# Patient Record
Sex: Female | Born: 1950 | Race: White | Hispanic: No | Smoking: Former smoker
Health system: Southern US, Community
[De-identification: ages and names within clinical notes are randomized; demographics above are authoritative.]

## PROBLEM LIST (undated history)

## (undated) DIAGNOSIS — M19019 Primary osteoarthritis, unspecified shoulder: Secondary | ICD-10-CM

## (undated) DIAGNOSIS — E785 Hyperlipidemia, unspecified: Secondary | ICD-10-CM

## (undated) DIAGNOSIS — G459 Transient cerebral ischemic attack, unspecified: Secondary | ICD-10-CM

## (undated) DIAGNOSIS — K219 Gastro-esophageal reflux disease without esophagitis: Secondary | ICD-10-CM

## (undated) DIAGNOSIS — F32A Depression, unspecified: Secondary | ICD-10-CM

## (undated) DIAGNOSIS — I10 Essential (primary) hypertension: Secondary | ICD-10-CM

## (undated) DIAGNOSIS — F329 Major depressive disorder, single episode, unspecified: Secondary | ICD-10-CM

## (undated) HISTORY — DX: Gastro-esophageal reflux disease without esophagitis: K21.9

## (undated) HISTORY — DX: Transient cerebral ischemic attack, unspecified: G45.9

## (undated) HISTORY — DX: Depression, unspecified: F32.A

## (undated) HISTORY — DX: Major depressive disorder, single episode, unspecified: F32.9

## (undated) HISTORY — PX: OVARIAN CYST REMOVAL: SHX89

## (undated) HISTORY — DX: Primary osteoarthritis, unspecified shoulder: M19.019

## (undated) HISTORY — DX: Essential (primary) hypertension: I10

## (undated) HISTORY — DX: Hyperlipidemia, unspecified: E78.5

## (undated) HISTORY — PX: TONSILLECTOMY AND ADENOIDECTOMY: SUR1326

## (undated) HISTORY — PX: ABDOMINAL HYSTERECTOMY: SHX81

---

## 2004-02-29 HISTORY — PX: BREAST SURGERY: SHX581

## 2004-05-28 ENCOUNTER — Ambulatory Visit: Payer: Self-pay | Admitting: Unknown Physician Specialty

## 2004-06-10 ENCOUNTER — Encounter: Payer: Self-pay | Admitting: Unknown Physician Specialty

## 2004-06-28 ENCOUNTER — Encounter: Payer: Self-pay | Admitting: Unknown Physician Specialty

## 2004-07-29 ENCOUNTER — Encounter: Payer: Self-pay | Admitting: Unknown Physician Specialty

## 2004-08-26 ENCOUNTER — Ambulatory Visit: Payer: Self-pay | Admitting: Unknown Physician Specialty

## 2004-08-28 ENCOUNTER — Encounter: Payer: Self-pay | Admitting: Unknown Physician Specialty

## 2005-01-04 ENCOUNTER — Ambulatory Visit: Payer: Self-pay | Admitting: Unknown Physician Specialty

## 2007-11-14 ENCOUNTER — Ambulatory Visit: Payer: Self-pay | Admitting: Unknown Physician Specialty

## 2008-02-29 LAB — HM DEXA SCAN: HM DEXA SCAN: NORMAL

## 2008-03-12 ENCOUNTER — Ambulatory Visit: Payer: Self-pay | Admitting: Unknown Physician Specialty

## 2008-12-16 ENCOUNTER — Ambulatory Visit: Payer: Self-pay | Admitting: Unknown Physician Specialty

## 2008-12-31 ENCOUNTER — Ambulatory Visit: Payer: Self-pay | Admitting: Unknown Physician Specialty

## 2009-11-17 ENCOUNTER — Ambulatory Visit: Payer: Self-pay

## 2009-12-28 ENCOUNTER — Ambulatory Visit: Payer: Self-pay | Admitting: General Practice

## 2010-01-06 ENCOUNTER — Ambulatory Visit: Payer: Self-pay | Admitting: General Practice

## 2011-03-29 ENCOUNTER — Ambulatory Visit: Payer: Self-pay | Admitting: Unknown Physician Specialty

## 2011-05-24 ENCOUNTER — Ambulatory Visit (INDEPENDENT_AMBULATORY_CARE_PROVIDER_SITE_OTHER): Payer: BC Managed Care – PPO | Admitting: Internal Medicine

## 2011-05-24 ENCOUNTER — Encounter: Payer: Self-pay | Admitting: Internal Medicine

## 2011-05-24 VITALS — BP 110/78 | HR 65 | Temp 97.6°F | Ht 66.5 in | Wt 175.0 lb

## 2011-05-24 DIAGNOSIS — Z23 Encounter for immunization: Secondary | ICD-10-CM

## 2011-05-24 DIAGNOSIS — E785 Hyperlipidemia, unspecified: Secondary | ICD-10-CM

## 2011-05-24 DIAGNOSIS — K219 Gastro-esophageal reflux disease without esophagitis: Secondary | ICD-10-CM | POA: Insufficient documentation

## 2011-05-24 DIAGNOSIS — I10 Essential (primary) hypertension: Secondary | ICD-10-CM | POA: Insufficient documentation

## 2011-05-24 DIAGNOSIS — E119 Type 2 diabetes mellitus without complications: Secondary | ICD-10-CM

## 2011-05-24 DIAGNOSIS — F39 Unspecified mood [affective] disorder: Secondary | ICD-10-CM | POA: Insufficient documentation

## 2011-05-24 DIAGNOSIS — M19019 Primary osteoarthritis, unspecified shoulder: Secondary | ICD-10-CM | POA: Insufficient documentation

## 2011-05-24 DIAGNOSIS — F329 Major depressive disorder, single episode, unspecified: Secondary | ICD-10-CM

## 2011-05-24 DIAGNOSIS — G459 Transient cerebral ischemic attack, unspecified: Secondary | ICD-10-CM | POA: Insufficient documentation

## 2011-05-24 NOTE — Assessment & Plan Note (Signed)
Feels she has ADHD Mood lability under control with fluoxetine

## 2011-05-24 NOTE — Assessment & Plan Note (Signed)
Had good control with last A1c ~6 Will get her records Trying to get better on lifestyle again

## 2011-05-24 NOTE — Patient Instructions (Signed)
Please get last 2 years notes and labs, as well as all diagnostic tests (MRI, carotid, etc--no time restriction on this) from Dr Lin Givens

## 2011-05-24 NOTE — Assessment & Plan Note (Signed)
Good control Will reassess via her last labs and next time ?change to generic

## 2011-05-24 NOTE — Assessment & Plan Note (Signed)
BP Readings from Last 3 Encounters:  05/24/11 110/78   Good control No changes needed

## 2011-05-24 NOTE — Progress Notes (Signed)
Subjective:    Patient ID: Catherine Hardin, female    DOB: April 07, 1950, 61 y.o.   MRN: [id number]  HPI Establishing here Dr Lin Givens left  Lots of stress Husband with inoperable pancreatic cancer Now getting chemo regimen Very weak so she has sig caregiving responsibility  NIDDM diagnosed in January 2012 Oral meds Checks sugars intermittently---hard with husband's issues Has indulged in comfort foods and weight up 10# Had been running but has gotten out of the habit Last labs okay in December  High cholesterol diagnosed Control is very good on the low dose crestor  Chronic mood issues Mostly depression --- does cycle moods at times. Wonders about ADHD Fluoxetine keeps her level Using xanax just since husband's cancer diagnosis  GERD controlled with protonix No chronic cough No swallowing problems  Had past urinary incontinence Better with exercise program Has not needed the oxybutynin of late  No current outpatient prescriptions on file prior to visit.    Allergies  Allergen Reactions  . Penicillins     Past Medical History  Diagnosis Date  . Hyperlipidemia   . Hypertension   . GERD (gastroesophageal reflux disease)   . Diabetes mellitus   . Osteoarthritis, shoulder   . Depression     Past Surgical History  Procedure Date  . Breast surgery 2006    Breast reduction  . Abdominal hysterectomy   . Tonsillectomy and adenoidectomy   . Ovarian cyst removal ~1980s    Family History  Problem Relation Age of Onset  . Hypertension Mother   . Stroke Mother   . COPD Father   . Hypertension Father   . Heart disease Father   . ADD / ADHD Grandchild     History   Social History  . Marital Status: Married    Spouse Name: N/A    Number of Children: 3  . Years of Education: N/A   Occupational History  . 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 Smoking status: Former Smoker    Quit date: 03/01/1991  . Smokeless  tobacco: Never Used  . Alcohol Use: Yes  . Drug Use: No  . Sexually Active: Not on file   Other Topics Concern  . Not on file   Social History Narrative   2 children of husband's and one of her own   Review of Systems  Constitutional: Positive for unexpected weight change. Negative for fatigue.       Weight up 10# Wears seat belt  HENT: Positive for hearing loss and tinnitus. Negative for congestion and rhinorrhea.   Eyes: Negative for visual disturbance.       Due for eye exam  Respiratory: Negative for cough, chest tightness and shortness of breath.   Cardiovascular: Negative for chest pain, palpitations and leg swelling.  Gastrointestinal: Negative for nausea, vomiting, abdominal pain, constipation and blood in stool.       Heartburn controlled with med  Genitourinary: Negative for urgency and frequency.       Urine incontinence better now  Musculoskeletal: Positive for arthralgias. Negative for joint swelling and gait problem.  Neurological: Negative for dizziness, syncope, weakness, light-headedness, numbness and headaches.  Psychiatric/Behavioral: Positive for sleep disturbance and dysphoric mood. The patient is nervous/anxious and is hyperactive.        Some sleep problems with husband's illness Mood controlled with med       Objective:   Physical Exam  Constitutional: She appears well-developed and  well-nourished. No distress.  HENT:  Mouth/Throat: Oropharynx is clear and moist. No oropharyngeal exudate.  Neck: Normal range of motion. Neck supple. No thyromegaly present.  Cardiovascular: Normal rate, regular rhythm, normal heart sounds and intact distal pulses.  Exam reveals no gallop.   No murmur heard. Pulmonary/Chest: Effort normal and breath sounds normal. No respiratory distress. She has no wheezes. She has no rales.  Abdominal: Soft. There is no tenderness.  Musculoskeletal: She exhibits no edema and no tenderness.  Lymphadenopathy:    She has no cervical  adenopathy.  Skin: No rash noted. No erythema.       Scattered callouses on feet---at 1st and 5th MTPs and slightly on plantar side  Psychiatric: She has a normal mood and affect. Her behavior is normal. Judgment and thought content normal.          Assessment & Plan:

## 2011-05-25 ENCOUNTER — Ambulatory Visit: Payer: Self-pay | Admitting: Internal Medicine

## 2011-07-01 ENCOUNTER — Other Ambulatory Visit: Payer: Self-pay | Admitting: *Deleted

## 2011-07-01 NOTE — Telephone Encounter (Signed)
Faxed refill request   

## 2011-07-02 NOTE — Telephone Encounter (Signed)
Okay #60 x 0 

## 2011-07-04 MED ORDER — ALPRAZOLAM 0.25 MG PO TABS: 0.2500 mg | ORAL_TABLET | Freq: Two times a day (BID) | ORAL | Status: DC | PRN

## 2011-07-04 NOTE — Telephone Encounter (Signed)
rx called into pharmacy

## 2011-09-21 ENCOUNTER — Telehealth: Payer: Self-pay | Admitting: Internal Medicine

## 2011-09-21 NOTE — Telephone Encounter (Signed)
Pt was wanting to know if someone would call her back regarding some chest discomfort. She is not in any distress she wanted to speak with a nurse.

## 2011-09-22 ENCOUNTER — Telehealth: Payer: Self-pay | Admitting: Internal Medicine

## 2011-09-22 NOTE — Telephone Encounter (Signed)
.  left message to have patient return my call.  

## 2011-09-23 NOTE — Telephone Encounter (Signed)
.  left message to have patient return my call.  

## 2011-10-18 ENCOUNTER — Ambulatory Visit (INDEPENDENT_AMBULATORY_CARE_PROVIDER_SITE_OTHER): Payer: BC Managed Care – PPO | Admitting: Internal Medicine

## 2011-10-18 ENCOUNTER — Ambulatory Visit (INDEPENDENT_AMBULATORY_CARE_PROVIDER_SITE_OTHER)
Admission: RE | Admit: 2011-10-18 | Discharge: 2011-10-18 | Disposition: A | Discharge status: Home / Self Care | Payer: BC Managed Care – PPO | Source: Ambulatory Visit | Attending: Internal Medicine | Admitting: Internal Medicine

## 2011-10-18 ENCOUNTER — Encounter: Payer: Self-pay | Admitting: *Deleted

## 2011-10-18 ENCOUNTER — Encounter: Payer: Self-pay | Admitting: Internal Medicine

## 2011-10-18 VITALS — BP 118/70 | HR 67 | Temp 98.6°F | Ht 65.5 in | Wt 178.0 lb

## 2011-10-18 DIAGNOSIS — Z0000 Encounter for general adult medical examination without abnormal findings: Secondary | ICD-10-CM | POA: Insufficient documentation

## 2011-10-18 DIAGNOSIS — Z1211 Encounter for screening for malignant neoplasm of colon: Secondary | ICD-10-CM

## 2011-10-18 DIAGNOSIS — E785 Hyperlipidemia, unspecified: Secondary | ICD-10-CM

## 2011-10-18 DIAGNOSIS — M19019 Primary osteoarthritis, unspecified shoulder: Secondary | ICD-10-CM

## 2011-10-18 DIAGNOSIS — E119 Type 2 diabetes mellitus without complications: Secondary | ICD-10-CM

## 2011-10-18 DIAGNOSIS — R079 Chest pain, unspecified: Secondary | ICD-10-CM

## 2011-10-18 DIAGNOSIS — K219 Gastro-esophageal reflux disease without esophagitis: Secondary | ICD-10-CM

## 2011-10-18 DIAGNOSIS — I10 Essential (primary) hypertension: Secondary | ICD-10-CM

## 2011-10-18 DIAGNOSIS — F329 Major depressive disorder, single episode, unspecified: Secondary | ICD-10-CM

## 2011-10-18 DIAGNOSIS — F3289 Other specified depressive episodes: Secondary | ICD-10-CM

## 2011-10-18 LAB — BASIC METABOLIC PANEL
BUN: 14 mg/dL (ref 6–23)
CO2: 30 mEq/L (ref 19–32)
Chloride: 100 mEq/L (ref 96–112)
Creatinine, Ser: 0.7 mg/dL (ref 0.4–1.2)
Glucose, Bld: 85 mg/dL (ref 70–99)

## 2011-10-18 LAB — CBC WITH DIFFERENTIAL/PLATELET
Basophils Relative: 0.6 % (ref 0.0–3.0)
Eosinophils Absolute: 0.1 10*3/uL (ref 0.0–0.7)
MCHC: 33.2 g/dL (ref 30.0–36.0)
MCV: 90 fl (ref 78.0–100.0)
Monocytes Absolute: 0.3 10*3/uL (ref 0.1–1.0)
Neutrophils Relative %: 59.1 % (ref 43.0–77.0)
RBC: 4.22 Mil/uL (ref 3.87–5.11)
RDW: 13.9 % (ref 11.5–14.6)

## 2011-10-18 LAB — MICROALBUMIN / CREATININE URINE RATIO
Creatinine,U: 54.5 mg/dL
Microalb Creat Ratio: 0.9 mg/g (ref 0.0–30.0)
Microalb, Ur: 0.5 mg/dL (ref 0.0–1.9)

## 2011-10-18 LAB — LIPID PANEL: Cholesterol: 225 mg/dL — ABNORMAL HIGH (ref 0–200)

## 2011-10-18 LAB — HEPATIC FUNCTION PANEL
ALT: 47 U/L — ABNORMAL HIGH (ref 0–35)
Total Bilirubin: 0.7 mg/dL (ref 0.3–1.2)
Total Protein: 7.2 g/dL (ref 6.0–8.3)

## 2011-10-18 LAB — LDL CHOLESTEROL, DIRECT: Direct LDL: 145.9 mg/dL

## 2011-10-18 LAB — HEMOGLOBIN A1C: Hgb A1c MFr Bld: 6.6 % — ABNORMAL HIGH (ref 4.6–6.5)

## 2011-10-18 NOTE — Progress Notes (Signed)
Subjective:    Patient ID: Catherine Hardin, female    DOB: May 27, 1950, 61 y.o.   MRN: [id number]  HPI Here for physical Due for colonoscopy but stress with husband's cancer ---will do immunoassay  Weight was down some---just put it back on again though Checks sugars daily--occ bid.  Checks fasting and usually ~130  Mood is variable Has had trouble with expressive aphasia in past 2 months---had feeling of "something is not right" Cut back prozac to once a day and she felt better Then she stopped it and noticed improvement in energy levels  Speech back to normal Husband noticed a difference so she has restarted  Had some chest heaviness in mid sternum--and to back No evaluation as yet Backed off on ibuprofen Better now  Shoulder is still bad Feels like it dislocates at times Did need vicodin last night Needs ortho eval  Current Outpatient Prescriptions on File Prior to Visit  Medication Sig Dispense Refill  . ALPRAZolam (XANAX) 0.25 MG tablet Take 1 tablet (0.25 mg total) by mouth 2 (two) times daily as needed.  60 tablet  0  . aspirin 81 MG tablet Take 81 mg by mouth daily.       . CRESTOR 5 MG tablet Take 5 mg by mouth daily.       . hydrochlorothiazide (HYDRODIURIL) 25 MG tablet Take 12.5-25 mg by mouth daily.       . metFORMIN (GLUCOPHAGE) 1000 MG tablet Take 1,500 mg by mouth 2 (two) times daily with a meal.       . oxybutynin (DITROPAN XL) 15 MG 24 hr tablet Take 15 mg by mouth daily as needed.       . pantoprazole (PROTONIX) 40 MG tablet Take 40 mg by mouth daily.       Marland Kitchen PROZAC 20 MG capsule Take 20 mg by mouth every other day.         Allergies  Allergen Reactions  . Penicillins     Past Medical History  Diagnosis Date  . Hyperlipidemia   . Hypertension   . GERD (gastroesophageal reflux disease)   . Diabetes mellitus   . Osteoarthritis, shoulder   . Depression   . TIA (transient ischemic attack) ~2008    sensory symptoms in head. MRI/carotids negative     Past Surgical History  Procedure Date  . Breast surgery 2006    Breast reduction  . Abdominal hysterectomy   . Tonsillectomy and adenoidectomy   . Ovarian cyst removal ~1980s    Family History  Problem Relation Age of Onset  . Hypertension Mother   . Stroke Mother   . COPD Father   . Hypertension Father   . Heart disease Father   . ADD / ADHD Grandchild     History   Social History  . Marital Status: Married    Spouse Name: N/A    Number of Children: 3  . Years of Education: N/A   Occupational History  . 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 Smoking status: Former Smoker    Quit date: 03/01/1991  . Smokeless tobacco: Never Used  . Alcohol Use: Yes  . Drug Use: No  . Sexually Active: Not on file   Other Topics Concern  . Not on file   Social History Narrative   2 children of husband's and one of her own   Review of Systems  Constitutional: Negative for fatigue and unexpected  weight change.       Wears seat belt  HENT: Positive for hearing loss. Negative for congestion, rhinorrhea, dental problem and tinnitus.        Some inner ear equilibrium issues Regular with dentist  Eyes: Negative for visual disturbance.       No diplopia or unilateral vision loss  Respiratory: Negative for cough, chest tightness and shortness of breath.   Cardiovascular: Positive for chest pain and palpitations. Negative for leg swelling.       No associated symptoms other than slight palpitations with the chest pain No sig exercise but does ride bike and walks when at the beach  Gastrointestinal: Negative for nausea, vomiting, abdominal pain, constipation and blood in stool.       No heartburn  Genitourinary: Positive for frequency. Negative for difficulty urinating.       Some incontinence still Only uses the oxybutynin prn No sex --no problem  Musculoskeletal: Positive for arthralgias. Negative for back pain and joint swelling.   Skin: Negative for rash.       No suspicious lesions  Neurological: Positive for numbness. Negative for dizziness, syncope, weakness and light-headedness.       Mild right hand numbness at times---positional  Hematological: Negative for adenopathy. Does not bruise/bleed easily.  Psychiatric/Behavioral: Positive for disturbed wake/sleep cycle and dysphoric mood. The patient is nervous/anxious.        Sleeps okay other than when shoulder acts up Some depression and anxiety Seeing psychologist at [facility] with husband---dealing with cancer issues       Objective:   Physical Exam  Constitutional: She is oriented to person, place, and time. She appears well-developed and well-nourished. No distress.  HENT:  Head: Normocephalic and atraumatic.  Right Ear: External ear normal.  Left Ear: External ear normal.  Mouth/Throat: Oropharynx is clear and moist. No oropharyngeal exudate.  Eyes: Conjunctivae and EOM are normal. Pupils are equal, round, and reactive to light.  Neck: Normal range of motion. Neck supple. No thyromegaly present.  Cardiovascular: Normal rate, regular rhythm, normal heart sounds and intact distal pulses.  Exam reveals no gallop.   No murmur heard. Pulmonary/Chest: Effort normal and breath sounds normal. No respiratory distress. She has no wheezes. She has no rales.  Abdominal: Soft. There is no tenderness.  Musculoskeletal: She exhibits no edema.       Moderate pain with passive ROM of right shoulder and restriction---especially with internal rotation and abduction  Lymphadenopathy:    She has no cervical adenopathy.  Neurological: She is alert and oriented to person, place, and time.  Skin: No rash noted. No erythema.  Psychiatric: She has a normal mood and affect. Her behavior is normal. Thought content normal.          Assessment & Plan:

## 2011-10-18 NOTE — Assessment & Plan Note (Signed)
Side effects at previous fluoxetine dose Recurrence of symptoms off Will restart at every other day

## 2011-10-18 NOTE — Assessment & Plan Note (Signed)
BP Readings from Last 3 Encounters:  10/18/11 118/70  05/24/11 110/78   Good control No changes needed

## 2011-10-18 NOTE — Assessment & Plan Note (Addendum)
Seems like esophageal pain by history EKG is normal (just 1 PAC) CXR looks normal Discussed this--will just limit the ibuprofen and try to work fitness

## 2011-10-18 NOTE — Assessment & Plan Note (Signed)
Needs to work on lifestyle Will check labs

## 2011-10-18 NOTE — Assessment & Plan Note (Signed)
On the PPI but chest pain likely from reflux due to ibuprofen Better since off the ibuprofen

## 2011-10-18 NOTE — Assessment & Plan Note (Signed)
She will set up with ortho at [facility] knows them there

## 2011-10-18 NOTE — Assessment & Plan Note (Signed)
No problems with statin Due for labs 

## 2011-10-18 NOTE — Assessment & Plan Note (Signed)
Needs to work on fitness Due for colon but no time---will do stool immunoassay Had mammo

## 2011-10-20 ENCOUNTER — Encounter: Payer: Self-pay | Admitting: *Deleted

## 2011-11-02 ENCOUNTER — Other Ambulatory Visit: Payer: Self-pay | Admitting: Internal Medicine

## 2011-11-02 NOTE — Telephone Encounter (Signed)
Called in as directed. JJ at [facility] asks that you call her in reguards to patient's metformin. The patient apparently has asked the pharmacist to speak with you. [phone]

## 2011-11-02 NOTE — Telephone Encounter (Signed)
Okay #60 x 0 

## 2011-11-03 ENCOUNTER — Other Ambulatory Visit: Payer: Self-pay | Admitting: *Deleted

## 2011-11-03 MED ORDER — OXYBUTYNIN CHLORIDE ER 15 MG PO TB24: 15.0000 mg | ORAL_TABLET | Freq: Every day | ORAL | Status: DC | PRN

## 2011-11-03 MED ORDER — GLUCOSE BLOOD VI STRP: ORAL_STRIP | Status: AC

## 2011-11-03 NOTE — Telephone Encounter (Signed)
Left message for Catherine Hardin  i did note the dose was too high Will decrease to 1000mg  bid If there was something else, she should call back

## 2011-11-07 ENCOUNTER — Other Ambulatory Visit: Payer: Self-pay | Admitting: Internal Medicine

## 2011-11-24 ENCOUNTER — Other Ambulatory Visit: Payer: Self-pay | Admitting: Internal Medicine

## 2011-11-25 NOTE — Telephone Encounter (Signed)
Okay #60 x 0 

## 2011-11-25 NOTE — Telephone Encounter (Signed)
rx called into pharmacy

## 2011-12-23 HISTORY — PX: ROTATOR CUFF REPAIR: SHX139

## 2012-01-02 ENCOUNTER — Ambulatory Visit (INDEPENDENT_AMBULATORY_CARE_PROVIDER_SITE_OTHER): Payer: BC Managed Care – PPO | Admitting: Internal Medicine

## 2012-01-02 ENCOUNTER — Telehealth: Payer: Self-pay | Admitting: Internal Medicine

## 2012-01-02 ENCOUNTER — Encounter: Payer: Self-pay | Admitting: Internal Medicine

## 2012-01-02 VITALS — BP 112/70 | HR 78 | Temp 97.7°F | Wt 174.0 lb

## 2012-01-02 DIAGNOSIS — Z23 Encounter for immunization: Secondary | ICD-10-CM

## 2012-01-02 DIAGNOSIS — R1013 Epigastric pain: Secondary | ICD-10-CM

## 2012-01-02 NOTE — Addendum Note (Signed)
Addended by: Sueanne Margarita on: 01/02/2012 04:14 PM   Modules accepted: Orders

## 2012-01-02 NOTE — Assessment & Plan Note (Signed)
This could be gallbladder but location is not classic Pain into back could be gallbladder or ulcer Possible ulcer back 30-40 years ago Not aware of H pylori status Pancreatitis unlikely  Still drinks coffee Has stopped ibuprofen about 4 days ago  P: will check H pylori and lipase and CBC     RUQ ultrasound--if negative, will need reevaluation by Dr Mechele Collin. If positive, Dr Renda Rolls     Increase protonix to bid     Could consider empiric antibiotics if H pylori positive

## 2012-01-02 NOTE — Progress Notes (Signed)
Subjective:    Patient ID: Catherine Hardin, female    DOB: 02/02/1951, 61 y.o.   MRN: [id number]  HPI Here with husband and DIL  Was evaluated for the chest or epigastric pain Has persisted Gave up ibuprofen Last 2 days it has been worse----and worse when belches Tender under right ribs at times  Fairly constant pain but variable in intensity Mild a lot but unbearable at times occ worse after meals but not consistently  Pain radiates to back  No change in bowels No fever  No cough or SOB  Current Outpatient Prescriptions on File Prior to Visit  Medication Sig Dispense Refill  . aspirin 81 MG tablet Take 81 mg by mouth daily.       . CRESTOR 5 MG tablet Take 5 mg by mouth daily.       Marland Kitchen glucose blood test strip Use twice daily or as instructed  100 each  12  . hydrochlorothiazide (HYDRODIURIL) 25 MG tablet Take 12.5-25 mg by mouth daily.       . metFORMIN (GLUCOPHAGE) 1000 MG tablet Take 1/2 tab in the morning and 1 tab in the evening      . pantoprazole (PROTONIX) 40 MG tablet Take 40 mg by mouth daily.       Marland Kitchen PROZAC 20 MG capsule Take 20 mg by mouth every other day.       Marland Kitchen XANAX 0.25 MG tablet TAKE ONE TABLET BY MOUTH TWICE A DAY AS NEEDED.  60 tablet  0    Allergies  Allergen Reactions  . Penicillins     Past Medical History  Diagnosis Date  . Hyperlipidemia   . Hypertension   . GERD (gastroesophageal reflux disease)   . Diabetes mellitus   . Osteoarthritis, shoulder   . Depression   . TIA (transient ischemic attack) ~2008    sensory symptoms in head. MRI/carotids negative    Past Surgical History  Procedure Date  . Breast surgery 2006    Breast reduction  . Abdominal hysterectomy   . Tonsillectomy and adenoidectomy   . Ovarian cyst removal ~1980s    Family History  Problem Relation Age of Onset  . Hypertension Mother   . Stroke Mother   . COPD Father   . Hypertension Father   . Heart disease Father   . ADD / ADHD Grandchild     History    Social History  . Marital Status: Married    Spouse Name: N/A    Number of Children: 3  . Years of Education: N/A   Occupational History  . 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 Smoking status: Former Smoker    Quit date: 03/01/1991  . Smokeless tobacco: Never Used  . Alcohol Use: Yes  . Drug Use: No  . Sexually Active: Not on file   Other Topics Concern  . Not on file   Social History Narrative   2 children of husband's and one of her own   Review of Systems Some nausea in last couple of days No vomiting Appetite is okay but has tried to eat bland food Bowels are normal---no blood or change in color    Objective:   Physical Exam  Constitutional: She appears well-developed and well-nourished. No distress.  Neck: Normal range of motion. Neck supple.  Cardiovascular: Normal rate, regular rhythm and normal heart sounds.   Pulmonary/Chest: Effort normal and breath sounds normal. No respiratory  distress. She has no wheezes. She has no rales.  Abdominal: Soft. Bowel sounds are normal. She exhibits no distension and no mass. There is tenderness. There is no rebound and no guarding.       Mild epigastric tenderness without rebound or guarding  Musculoskeletal: She exhibits no edema.  Lymphadenopathy:    She has no cervical adenopathy.          Assessment & Plan:

## 2012-01-02 NOTE — Telephone Encounter (Signed)
See OV note.  

## 2012-01-02 NOTE — Telephone Encounter (Signed)
Caller: Aurielle/Patient; Patient Name: Catherine Hardin; PCP: Tillman Abide; Best Callback Phone Number: [phone]; Reason for call: Chest Pain/Chest Discomfort; Onset  x 1 month; CXR and EKG done in office 1 month ago which were normal. Reports increase in frequency of pain. Reports tenderness to the touch. Reports the pain is below the bottom of the sternum and the bottom of the chest/top of abdomen. Triaged per Chest Pain guideline, disposition: see provider within 72 hours for "Pain brought on by, or made worse by, pressure on a localized area and not previously evaluated." Appointment scheduled 01/02/12 at 2:45pm with Dr. Alphonsus Sias. Care advice and call back parameters given per guideline. Patient verbalized understanding.

## 2012-01-03 LAB — HEPATIC FUNCTION PANEL
ALT: 31 U/L (ref 0–35)
Albumin: 4.1 g/dL (ref 3.5–5.2)
Alkaline Phosphatase: 56 U/L (ref 39–117)
Bilirubin, Direct: 0.1 mg/dL (ref 0.0–0.3)

## 2012-01-03 LAB — CBC WITH DIFFERENTIAL/PLATELET
Basophils Relative: 2 % (ref 0.0–3.0)
Eosinophils Absolute: 0.1 10*3/uL (ref 0.0–0.7)
Eosinophils Relative: 1.9 % (ref 0.0–5.0)
Lymphocytes Relative: 34.2 % (ref 12.0–46.0)
Monocytes Relative: 5.7 % (ref 3.0–12.0)
Neutrophils Relative %: 56.2 % (ref 43.0–77.0)
RBC: 4.19 Mil/uL (ref 3.87–5.11)
WBC: 6.8 10*3/uL (ref 4.5–10.5)

## 2012-01-03 LAB — HELICOBACTER PYLORI  ANTIBODY, IGM: Helicobacter pylori, IgM: 1.7 U/mL

## 2012-01-03 LAB — LIPASE: Lipase: 41 U/L (ref 11.0–59.0)

## 2012-01-04 ENCOUNTER — Telehealth: Payer: Self-pay | Admitting: Internal Medicine

## 2012-01-04 ENCOUNTER — Ambulatory Visit: Payer: Self-pay | Admitting: Internal Medicine

## 2012-01-04 ENCOUNTER — Encounter: Payer: Self-pay | Admitting: Internal Medicine

## 2012-01-04 MED ORDER — PREDNISONE 20 MG PO TABS: 40.0000 mg | ORAL_TABLET | Freq: Every day | ORAL | Status: DC

## 2012-01-04 NOTE — Telephone Encounter (Signed)
Caller: Jose/Patient; Patient Name: Catherine Hardin; PCP: Tillman Abide; Best Callback Phone Number: [phone]; Calling regarding poison oak on 11/4.  Onset of it worsening was noted 01/03/12    hands, wrists,  forearms, feet, nape of neck in hairline and buttocks.   Has taken benadryl, has helped slightly--creams not helping;  Was unable to sleep last night.  Areas on fingers are starting blistering.  Triaged using [place], [place] or [place] with a disposition to be seen within 4 hours.  Patient declined appointment requesting a steroid dose pack instead.  Care advice given which patient has done most already.  Pharmacy is [facility] at [phone].  Last appointment with Dr. Alphonsus Sias was 01/02/12 and is coming out to see her husband who is a patient today at 14:30.  OFFICE:  PLEASE FOLLOW UP WITH PATIENT REGARDING REQUESTED TREATMENT FOR POISON OAK-WOULD LIKE A STEROID DOSE PACK CALLED IN TO PHARMACY OF CHOICE.  PLEASE NOTIFY HER WHEN CALLED IN. THANKS

## 2012-01-04 NOTE — Telephone Encounter (Signed)
pts wife notified it will be around 3:30 when Dr Alphonsus Sias will get there.

## 2012-01-04 NOTE — Telephone Encounter (Signed)
Has contact dermatitis that seemed to be improving but now worsened I checked her when at home visit for husband  Widespread rash with hand swelling Will send Rx for prednisone

## 2012-01-04 NOTE — Telephone Encounter (Signed)
Please let her know I will look at it when I see her husband and decide if steroid Rx is appropriate

## 2012-01-04 NOTE — Telephone Encounter (Signed)
Patient notified as instructed by telephone. 

## 2012-01-31 ENCOUNTER — Other Ambulatory Visit: Payer: Self-pay | Admitting: *Deleted

## 2012-01-31 MED ORDER — ALPRAZOLAM 0.25 MG PO TABS: 0.2500 mg | ORAL_TABLET | Freq: Two times a day (BID) | ORAL | Status: DC | PRN

## 2012-01-31 MED ORDER — PANTOPRAZOLE SODIUM 40 MG PO TBEC: 40.0000 mg | DELAYED_RELEASE_TABLET | Freq: Two times a day (BID) | ORAL | Status: DC

## 2012-01-31 NOTE — Telephone Encounter (Signed)
rx called into pharmacy

## 2012-01-31 NOTE — Telephone Encounter (Signed)
Okay #60 x 0 

## 2012-03-02 ENCOUNTER — Other Ambulatory Visit: Payer: Self-pay

## 2012-03-02 MED ORDER — ALPRAZOLAM 0.25 MG PO TABS: 0.2500 mg | ORAL_TABLET | Freq: Two times a day (BID) | ORAL | Status: DC | PRN

## 2012-03-02 NOTE — Telephone Encounter (Signed)
[facility] refill alprazolam. Last filled 01/31/12.Marland KitchenPlease advise.

## 2012-03-02 NOTE — Telephone Encounter (Signed)
plz phone in. 

## 2012-03-05 NOTE — Telephone Encounter (Signed)
Rx called in as directed.   

## 2012-05-16 ENCOUNTER — Other Ambulatory Visit: Payer: Self-pay | Admitting: *Deleted

## 2012-05-17 MED ORDER — ALPRAZOLAM 0.25 MG PO TABS: 0.2500 mg | ORAL_TABLET | Freq: Two times a day (BID) | ORAL | Status: DC | PRN

## 2012-05-17 NOTE — Telephone Encounter (Signed)
Okay #60 x 0 

## 2012-05-17 NOTE — Telephone Encounter (Signed)
rx called into pharmacy

## 2012-08-22 ENCOUNTER — Other Ambulatory Visit: Payer: Self-pay | Admitting: *Deleted

## 2012-08-23 MED ORDER — ALPRAZOLAM 0.25 MG PO TABS: 0.2500 mg | ORAL_TABLET | Freq: Two times a day (BID) | ORAL | Status: DC | PRN

## 2012-08-23 NOTE — Telephone Encounter (Signed)
rx called into pharmacy

## 2012-08-23 NOTE — Telephone Encounter (Signed)
Okay #60 x 0 

## 2012-11-07 ENCOUNTER — Other Ambulatory Visit: Payer: Self-pay | Admitting: Internal Medicine

## 2013-01-03 ENCOUNTER — Other Ambulatory Visit: Payer: Self-pay

## 2013-01-31 ENCOUNTER — Other Ambulatory Visit: Payer: Self-pay | Admitting: Internal Medicine

## 2013-01-31 NOTE — Telephone Encounter (Signed)
Refill requests, but no recent or future appts. pls advise

## 2013-02-01 NOTE — Telephone Encounter (Signed)
Her husband just died.....  Okay to refill for 3 months each Have her set up an appt within that time

## 2013-02-05 ENCOUNTER — Ambulatory Visit (INDEPENDENT_AMBULATORY_CARE_PROVIDER_SITE_OTHER): Payer: BC Managed Care – PPO | Admitting: Internal Medicine

## 2013-02-05 ENCOUNTER — Encounter: Payer: Self-pay | Admitting: Internal Medicine

## 2013-02-05 VITALS — BP 128/80 | HR 73 | Temp 98.3°F | Wt 180.0 lb

## 2013-02-05 DIAGNOSIS — E119 Type 2 diabetes mellitus without complications: Secondary | ICD-10-CM

## 2013-02-05 DIAGNOSIS — J019 Acute sinusitis, unspecified: Secondary | ICD-10-CM

## 2013-02-05 DIAGNOSIS — I10 Essential (primary) hypertension: Secondary | ICD-10-CM

## 2013-02-05 LAB — CBC WITH DIFFERENTIAL/PLATELET
Basophils Relative: 0.5 % (ref 0.0–3.0)
Eosinophils Absolute: 0.2 10*3/uL (ref 0.0–0.7)
Eosinophils Relative: 2.3 % (ref 0.0–5.0)
HCT: 38.9 % (ref 36.0–46.0)
Lymphocytes Relative: 34.8 % (ref 12.0–46.0)
Lymphs Abs: 2.3 10*3/uL (ref 0.7–4.0)
MCHC: 33.7 g/dL (ref 30.0–36.0)
MCV: 90 fl (ref 78.0–100.0)
Monocytes Absolute: 0.4 10*3/uL (ref 0.1–1.0)
Neutrophils Relative %: 57 % (ref 43.0–77.0)
Platelets: 187 10*3/uL (ref 150.0–400.0)
RBC: 4.33 Mil/uL (ref 3.87–5.11)
RDW: 14.1 % (ref 11.5–14.6)
WBC: 6.6 10*3/uL (ref 4.5–10.5)

## 2013-02-05 LAB — BASIC METABOLIC PANEL
BUN: 15 mg/dL (ref 6–23)
CO2: 26 mEq/L (ref 19–32)
GFR: 80 mL/min (ref >60.00)
Glucose, Bld: 105 mg/dL — ABNORMAL HIGH (ref 70–99)
Potassium: 4.1 mEq/L (ref 3.5–5.1)
Sodium: 134 mEq/L — ABNORMAL LOW (ref 135–145)

## 2013-02-05 LAB — LIPID PANEL
HDL: 57.8 mg/dL (ref >39.00)
Total CHOL/HDL Ratio: 5
Triglycerides: 281 mg/dL — ABNORMAL HIGH (ref 0.0–149.0)

## 2013-02-05 LAB — HEPATIC FUNCTION PANEL
Albumin: 4.5 g/dL (ref 3.5–5.2)
Alkaline Phosphatase: 59 U/L (ref 39–117)
Bilirubin, Direct: 0 mg/dL (ref 0.0–0.3)

## 2013-02-05 LAB — MICROALBUMIN / CREATININE URINE RATIO
Creatinine,U: 189.8 mg/dL
Microalb, Ur: 2.9 mg/dL — ABNORMAL HIGH (ref 0.0–1.9)

## 2013-02-05 LAB — TSH: TSH: 5.03 u[IU]/mL (ref 0.35–5.50)

## 2013-02-05 MED ORDER — AZITHROMYCIN 250 MG PO TABS: ORAL_TABLET | ORAL | Status: DC

## 2013-02-05 NOTE — Progress Notes (Signed)
Subjective:    Patient ID: Catherine Hardin, female    DOB: 11/22/1950, 62 y.o.   MRN: [id number]  HPI Having a real hard time with grieving Got sick right after husband died-- ~7 weeks ago Lost voice,etc Has kept lingering cough and flet like she was starting to improve Yesterday she felt worse Started with raw feeling in chest  Post nasal drip then increased  Very tired No fever No SOB No ear pain Occasional headaches  Tried cetirizine--not really helping Also ibuprofen  Sugars up a bit Hasn't been as careful with diet with all the stress Weight up slightly  Current Outpatient Prescriptions on File Prior to Visit  Medication Sig Dispense Refill  . ALPRAZolam (XANAX) 0.25 MG tablet Take 1 tablet (0.25 mg total) by mouth 2 (two) times daily as needed.  60 tablet  0  . aspirin 81 MG tablet Take 81 mg by mouth daily.       Marland Kitchen glucose blood test strip Use twice daily or as instructed  100 each  12  . hydrochlorothiazide (HYDRODIURIL) 25 MG tablet Take 12.5-25 mg by mouth daily.       . metFORMIN (GLUCOPHAGE) 1000 MG tablet TAKE ONE TABLET BY MOUTH TWICE A DAY. (DIABETES)  60 tablet  2  . oxybutynin (DITROPAN XL) 15 MG 24 hr tablet TAKE ONE TABLET BY MOUTH ONCE A DAY FOR BLADDER.  30 tablet  11  . pantoprazole (PROTONIX) 40 MG tablet TAKE ONE TABLET BY MOUTH TWICE A DAY. (G.E.R.D.) DO NOT CRUSH  60 tablet  2  . PROZAC 20 MG capsule Take 20 mg by mouth every other day.        No current facility-administered medications on file prior to visit.    Allergies  Allergen Reactions  . Penicillins     Past Medical History  Diagnosis Date  . Hyperlipidemia   . Hypertension   . GERD (gastroesophageal reflux disease)   . Diabetes mellitus   . Osteoarthritis, shoulder   . Depression   . TIA (transient ischemic attack) ~2008    sensory symptoms in head. MRI/carotids negative    Past Surgical History  Procedure Laterality Date  . Breast surgery  2006    Breast reduction  .  Abdominal hysterectomy    . Tonsillectomy and adenoidectomy    . Ovarian cyst removal  ~1980s  . Rotator cuff repair  12/23/11    Right ---Dr Daphine Deutscher at [facility] History  Problem Relation Age of Onset  . Hypertension Mother   . Stroke Mother   . COPD Father   . Hypertension Father   . Heart disease Father   . ADD / ADHD Grandchild     History   Social History  . Marital Status: Widowed    Spouse Name: N/A    Number of Children: 3  . Years of Education: N/A   Occupational History  . 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 Smoking status: Former Smoker    Quit date: 03/01/1991  . Smokeless tobacco: Never Used  . Alcohol Use: Yes  . Drug Use: No  . Sexual Activity: Not on file   Other Topics Concern  . Not on file   Social History Narrative   2 children of husband's and one of her own         Review of Systems No rash No vomiting or diarrhea Still eating okay  Objective:   Physical Exam  Constitutional: She appears well-developed and well-nourished. No distress.  HENT:  Mouth/Throat: Oropharynx is clear and moist. No oropharyngeal exudate.  No sinus tenderness TMs normal Marked nasal inflammation  Neck: Normal range of motion. Neck supple.  Pulmonary/Chest: Effort normal and breath sounds normal. No respiratory distress. She has no wheezes. She has no rales.  Lymphadenopathy:    She has no cervical adenopathy.          Assessment & Plan:

## 2013-02-05 NOTE — Assessment & Plan Note (Signed)
Has been checking sugars daily Weight is up some---not careful with her eating 140 fasting lately

## 2013-02-05 NOTE — Assessment & Plan Note (Signed)
BP Readings from Last 3 Encounters:  02/05/13 128/80  01/02/12 112/70  10/18/11 118/70   Good control still

## 2013-02-05 NOTE — Assessment & Plan Note (Signed)
Seems to have secondary bacterial infection Will treat with z-pak

## 2013-03-11 ENCOUNTER — Other Ambulatory Visit: Payer: Self-pay | Admitting: Internal Medicine

## 2013-04-29 ENCOUNTER — Encounter: Payer: Self-pay | Admitting: Internal Medicine

## 2013-06-04 ENCOUNTER — Other Ambulatory Visit: Payer: Self-pay | Admitting: Internal Medicine

## 2013-07-16 ENCOUNTER — Other Ambulatory Visit: Payer: Self-pay | Admitting: *Deleted

## 2013-07-16 MED ORDER — HYDROCHLOROTHIAZIDE 25 MG PO TABS: 12.5000 mg | ORAL_TABLET | Freq: Every day | ORAL | Status: DC | PRN

## 2013-08-07 ENCOUNTER — Telehealth: Payer: Self-pay | Admitting: Internal Medicine

## 2013-08-07 ENCOUNTER — Ambulatory Visit (INDEPENDENT_AMBULATORY_CARE_PROVIDER_SITE_OTHER): Payer: Medicare Other | Admitting: Internal Medicine

## 2013-08-07 ENCOUNTER — Encounter: Payer: Self-pay | Admitting: Internal Medicine

## 2013-08-07 VITALS — BP 120/78 | HR 65 | Temp 98.3°F | Ht 65.5 in | Wt 169.0 lb

## 2013-08-07 DIAGNOSIS — G4733 Obstructive sleep apnea (adult) (pediatric): Secondary | ICD-10-CM | POA: Insufficient documentation

## 2013-08-07 DIAGNOSIS — I10 Essential (primary) hypertension: Secondary | ICD-10-CM

## 2013-08-07 DIAGNOSIS — F39 Unspecified mood [affective] disorder: Secondary | ICD-10-CM

## 2013-08-07 DIAGNOSIS — G4730 Sleep apnea, unspecified: Secondary | ICD-10-CM

## 2013-08-07 DIAGNOSIS — E785 Hyperlipidemia, unspecified: Secondary | ICD-10-CM

## 2013-08-07 DIAGNOSIS — Z0000 Encounter for general adult medical examination without abnormal findings: Secondary | ICD-10-CM

## 2013-08-07 DIAGNOSIS — E119 Type 2 diabetes mellitus without complications: Secondary | ICD-10-CM

## 2013-08-07 DIAGNOSIS — Z23 Encounter for immunization: Secondary | ICD-10-CM

## 2013-08-07 LAB — MICROALBUMIN / CREATININE URINE RATIO
CREATININE, U: 61.9 mg/dL
MICROALB/CREAT RATIO: 4.7 mg/g (ref 0.0–30.0)
Microalb, Ur: 2.9 mg/dL — ABNORMAL HIGH (ref 0.0–1.9)

## 2013-08-07 LAB — HEMOGLOBIN A1C: HEMOGLOBIN A1C: 6.3 % (ref 4.6–6.5)

## 2013-08-07 LAB — HM DIABETES FOOT EXAM

## 2013-08-07 MED ORDER — ATORVASTATIN CALCIUM 40 MG PO TABS: 40.0000 mg | ORAL_TABLET | Freq: Every day | ORAL | Status: DC

## 2013-08-07 MED ORDER — ZOSTER VACCINE LIVE 19400 UNT/0.65ML ~~LOC~~ SOLR: 0.6500 mL | Freq: Once | SUBCUTANEOUS | Status: DC

## 2013-08-07 NOTE — Addendum Note (Signed)
Addended by: Sueanne Margarita on: 08/07/2013 11:45 AM   Modules accepted: Orders

## 2013-08-07 NOTE — Patient Instructions (Addendum)
Please contact Dr Vira Agar about setting up your screening colonoscopy. Please set up screening mammogram. Please set up your appointment with Dr Gloriann Loan for your eye exam Please restart vitamin D--- [phone] int units daily. Please set up blood work in about 6 weeks--- met c and lipid (272.4)  Diabetes Meal Planning Guide The diabetes meal planning guide is a tool to help you plan your meals and snacks. It is important for people with diabetes to manage their blood glucose (sugar) levels. Choosing the right foods and the right amounts throughout your day will help control your blood glucose. Eating right can even help you improve your blood pressure and reach or maintain a healthy weight. CARBOHYDRATE COUNTING MADE EASY When you eat carbohydrates, they turn to sugar. This raises your blood glucose level. Counting carbohydrates can help you control this level so you feel better. When you plan your meals by counting carbohydrates, you can have more flexibility in what you eat and balance your medicine with your food intake. Carbohydrate counting simply means adding up the total amount of carbohydrate grams in your meals and snacks. Try to eat about the same amount at each meal. Foods with carbohydrates are listed below. Each portion below is 1 carbohydrate serving or 15 grams of carbohydrates. Ask your dietician how many grams of carbohydrates you should eat at each meal or snack. Grains and Starches  1 slice bread.   English muffin or hotdog/hamburger bun.   cup cold cereal (unsweetened).   cup cooked pasta or rice.   cup starchy vegetables (corn, potatoes, peas, beans, winter squash).  1 tortilla (6 inches).   bagel.  1 waffle or pancake (size of a CD).   cup cooked cereal.  4 to 6 small crackers. *Whole grain is recommended. Fruit  1 cup fresh unsweetened berries, melon, papaya, pineapple.  1 small fresh fruit.   banana or mango.   cup fruit juice (4 oz  unsweetened).   cup canned fruit in natural juice or water.  2 tbs dried fruit.  12 to 15 grapes or cherries. Milk and Yogurt  1 cup fat-free or 1% milk.  1 cup soy milk.  6 oz light yogurt with sugar-free sweetener.  6 oz low-fat soy yogurt.  6 oz plain yogurt. Vegetables  1 cup raw or  cup cooked is counted as 0 carbohydrates or a "free" food.  If you eat 3 or more servings at 1 meal, count them as 1 carbohydrate serving. Other Carbohydrates   oz chips or pretzels.   cup ice cream or frozen yogurt.   cup sherbet or sorbet.  2 inch square cake, no frosting.  1 tbs honey, sugar, jam, jelly, or syrup.  2 small cookies.  3 squares of graham crackers.  3 cups popcorn.  6 crackers.  1 cup broth-based soup.  Count 1 cup casserole or other mixed foods as 2 carbohydrate servings.  Foods with less than 20 calories in a serving may be counted as 0 carbohydrates or a "free" food. You may want to purchase a book or computer software that lists the carbohydrate gram counts of different foods. In addition, the nutrition facts panel on the labels of the foods you eat are a good source of this information. The label will tell you how big the serving size is and the total number of carbohydrate grams you will be eating per serving. Divide this number by 15 to obtain the number of carbohydrate servings in a portion. Remember, 1 carbohydrate serving  equals 15 grams of carbohydrate. SERVING SIZES Measuring foods and serving sizes helps you make sure you are getting the right amount of food. The list below tells how big or small some common serving sizes are.  1 oz.........4 stacked dice.  3 oz........Marland KitchenDeck of cards.  1 tsp.......Marland KitchenTip of little finger.  1 tbs......Marland KitchenMarland KitchenThumb.  2 tbs.......Marland KitchenGolf ball.   cup......Marland KitchenHalf of a fist.  1 cup.......Marland KitchenA fist. SAMPLE DIABETES MEAL PLAN Below is a sample meal plan that includes foods from the grain and starches, dairy, vegetable,  fruit, and meat groups. A dietician can individualize a meal plan to fit your calorie needs and tell you the number of servings needed from each food group. However, controlling the total amount of carbohydrates in your meal or snack is more important than making sure you include all of the food groups at every meal. You may interchange carbohydrate containing foods (dairy, starches, and fruits). The meal plan below is an example of a 2000 calorie diet using carbohydrate counting. This meal plan has 17 carbohydrate servings. Breakfast  1 cup oatmeal (2 carb servings).   cup light yogurt (1 carb serving).  1 cup blueberries (1 carb serving).   cup almonds. Snack  1 large apple (2 carb servings).  1 low-fat string cheese stick. Lunch  Chicken breast salad.  1 cup spinach.   cup chopped tomatoes.  2 oz chicken breast, sliced.  2 tbs low-fat [place] dressing.  12 whole-wheat crackers (2 carb servings).  12 to 15 grapes (1 carb serving).  1 cup low-fat milk (1 carb serving). Snack  1 cup carrots.   cup hummus (1 carb serving). Dinner  3 oz broiled salmon.  1 cup brown rice (3 carb servings). Snack  1  cups steamed broccoli (1 carb serving) drizzled with 1 tsp olive oil and lemon juice.  1 cup light pudding (2 carb servings). DIABETES MEAL PLANNING WORKSHEET Your dietician can use this worksheet to help you decide how many servings of foods and what types of foods are right for you.  BREAKFAST Food Group and Servings / Carb Servings Grain/Starches __________________________________ Dairy __________________________________________ Vegetable ______________________________________ Fruit ___________________________________________ Meat __________________________________________ Fat ____________________________________________ LUNCH Food Group and Servings / Carb Servings Grain/Starches ___________________________________ Dairy  ___________________________________________ Fruit ____________________________________________ Meat ___________________________________________ Fat _____________________________________________ Catherine Hardin Food Group and Servings / Carb Servings Grain/Starches ___________________________________ Dairy ___________________________________________ Fruit ____________________________________________ Meat ___________________________________________ Fat _____________________________________________ SNACKS Food Group and Servings / Carb Servings Grain/Starches ___________________________________ Dairy ___________________________________________ Vegetable _______________________________________ Fruit ____________________________________________ Meat ___________________________________________ Fat _____________________________________________ DAILY TOTALS Starches _________________________ Vegetable ________________________ Fruit ____________________________ Dairy ____________________________ Meat ____________________________ Fat ______________________________ Document Released: 11/11/2004 Document Revised: 05/09/2011 Document Reviewed: 09/22/2008 Exit[employer] Patient Information 2014 [employer].

## 2013-08-07 NOTE — Telephone Encounter (Signed)
Relevant patient education assigned to patient using Emmi. ° °

## 2013-08-07 NOTE — Assessment & Plan Note (Signed)
I have personally reviewed the Medicare Annual Wellness questionnaire and have noted 1. The patient's medical and social history 2. Their use of alcohol, tobacco or illicit drugs 3. Their current medications and supplements 4. The patient's functional ability including ADL's, fall risks, home safety risks and hearing or visual             impairment. 5. Diet and physical activities 6. Evidence for depression or mood disorders  The patients weight, height, BMI and visual acuity have been recorded in the chart I have made referrals, counseling and provided education to the patient based review of the above and I have provided the pt with a written personalized care plan for preventive services.  I have provided you with a copy of your personalized plan for preventive services. Please take the time to review along with your updated medication list.  Order for zostavax prevnar and hep A today Due for pneumovax booster in 3 years Yearly flu shot Mammogram due now and every 2 years Colonoscopy due now Had DEXA

## 2013-08-07 NOTE — Assessment & Plan Note (Signed)
Better Will try to decrease the fluoxetine to daily

## 2013-08-07 NOTE — Assessment & Plan Note (Signed)
BP Readings from Last 3 Encounters:  08/07/13 120/78  02/05/13 128/80  01/02/12 112/70   Good control Check urine microal also---ACEI or ARB if positive

## 2013-08-07 NOTE — Progress Notes (Signed)
Pre visit review using our clinic review tool, if applicable. No additional management support is needed unless otherwise documented below in the visit note. 

## 2013-08-07 NOTE — Assessment & Plan Note (Signed)
Off her med Will restart with atorvastatin

## 2013-08-07 NOTE — Assessment & Plan Note (Signed)
Son has seen her stop breathing but not clear for how long She doesn't feel she has much daytime somnolence I will set her up with sleep eval to determine if further testing is indicated

## 2013-08-07 NOTE — Addendum Note (Signed)
Addended by: Tillman Abide I on: 08/07/2013 01:54 PM   Modules accepted: Orders

## 2013-08-07 NOTE — Assessment & Plan Note (Signed)
Seems to still have good control Will check A1c Diet info given

## 2013-08-07 NOTE — Progress Notes (Signed)
Subjective:    Patient ID: Catherine Hardin, female    DOB: 01-28-1951, 63 y.o.   MRN: [id number]  HPI Here for initial Medicare wellness and follow up Reviewed form No falls Not really depressed though still grieving. Not anhedonic. Regular 1-2 drinks per day No tobacco Vision and hearing are okay. Just mild hearing loss that isn't a functional issue. Overdue for eye exam Started back to exercise---joined gym, rides bike and walking Independent in instrumental ADLs No cognitive changes  Checks sugars as much as bid Usually 120's fasting, 140's at night Sugars did go way up as she tried to come off the fluoxetine Overdue for eye exam No numbness, sores or pain in feet  Mood has been fine Still on the fluoxetine Discussed trying just one a day  No chest pain No SOB No dizziness or syncope No edema  Is concerned about sleep apnea Has always had bad snoring--occ wakes herself up Now will awaken at night and need sighing breath Son has noticed apnea No daytime somnolence and awakens refreshed  Is travelling to [place] in August Will need malaria med and antibiotic (just in case) Will give hep A today  Current Outpatient Prescriptions on File Prior to Visit  Medication Sig Dispense Refill  . aspirin 81 MG tablet Take 81 mg by mouth daily.       Marland Kitchen FLUoxetine (PROZAC) 20 MG capsule TAKE 2 CAPS BY MOUTH ONCE DAILY. (DEPRESSION)  60 capsule  11  . glucose blood test strip Use twice daily or as instructed  100 each  12  . hydrochlorothiazide (HYDRODIURIL) 25 MG tablet Take 0.5-1 tablets (12.5-25 mg total) by mouth daily as needed (swelling-edema).  30 tablet  0  . metFORMIN (GLUCOPHAGE) 1000 MG tablet TAKE ONE TABLET BY MOUTH TWICE A DAY. (DIABETES)  60 tablet  11  . oxybutynin (DITROPAN XL) 15 MG 24 hr tablet TAKE ONE TABLET BY MOUTH ONCE A DAY FOR BLADDER.  30 tablet  11  . pantoprazole (PROTONIX) 40 MG tablet TAKE ONE TABLET BY MOUTH TWICE A DAY. (G.E.R.D.) DO NOT CRUSH   60 tablet  11   No current facility-administered medications on file prior to visit.    Allergies  Allergen Reactions  . Penicillins     Past Medical History  Diagnosis Date  . Hyperlipidemia   . Hypertension   . GERD (gastroesophageal reflux disease)   . Diabetes mellitus   . Osteoarthritis, shoulder   . Depression   . TIA (transient ischemic attack) ~2008    sensory symptoms in head. MRI/carotids negative    Past Surgical History  Procedure Laterality Date  . Breast surgery  2006    Breast reduction  . Abdominal hysterectomy    . Tonsillectomy and adenoidectomy    . Ovarian cyst removal  ~1980s  . Rotator cuff repair  12/23/11    Right ---[facility] Daphine Deutscher at [facility] History  Problem Relation Age of Onset  . Hypertension Mother   . Stroke Mother   . COPD Father   . Hypertension Father   . Heart disease Father   . ADD / ADHD Grandchild     History   Social History  . Marital Status: Widowed    Spouse Name: N/A    Number of Children: 3  . Years of Education: N/A   Occupational History  . RN     Retired   Social History Main Topics  . Smoking status: Former Smoker  Quit date: 03/01/1991  . Smokeless tobacco: Never Used  . Alcohol Use: Yes  . Drug Use: No  . Sexual Activity: Not on file   Other Topics Concern  . Not on file   Social History Narrative   2 stepchildren and one of her own      Has living will   Her son Myrtis SerJoe Thornton is health care POA   Would not accept resuscitation but no prolonged ventilation   No tube feeds if cognitively unaware.            Review of Systems Uses the ditropan very rarely--uses prn only. Seems better with weight loss and more regular exercise. Does Kegel's also Bowels are fine Had normal bone density a few years ago    Objective:   Physical Exam  Constitutional: She is oriented to person, place, and time. She appears well-developed and well-nourished. No distress.  HENT:  Mouth/Throat:  Oropharynx is clear and moist. No oropharyngeal exudate.  Neck: Normal range of motion. Neck supple. No thyromegaly present.  Cardiovascular: Normal rate, regular rhythm, normal heart sounds and intact distal pulses.  Exam reveals no gallop.   No murmur heard. Pulmonary/Chest: Effort normal and breath sounds normal. No respiratory distress. She has no wheezes. She has no rales.  Abdominal: Soft. There is no tenderness.  Genitourinary:  Mild periareolar cystic changes in both breasts  Musculoskeletal: She exhibits no edema and no tenderness.  Lymphadenopathy:    She has no cervical adenopathy.    She has no axillary adenopathy.  Neurological: She is alert and oriented to person, place, and time.  President  "Obama, Bush, Bush.. Then Clinton" [id number] Recall 2/3  Skin: No rash noted.  No foot lesions--normal sensation  Psychiatric: She has a normal mood and affect. Her behavior is normal.          Assessment & Plan:

## 2013-08-08 ENCOUNTER — Other Ambulatory Visit: Payer: Self-pay | Admitting: *Deleted

## 2013-08-08 MED ORDER — HYDROCHLOROTHIAZIDE 25 MG PO TABS: 12.5000 mg | ORAL_TABLET | Freq: Every day | ORAL | Status: DC | PRN

## 2013-09-12 ENCOUNTER — Encounter: Payer: Self-pay | Admitting: Internal Medicine

## 2013-09-12 ENCOUNTER — Other Ambulatory Visit: Payer: Self-pay | Admitting: Internal Medicine

## 2013-09-12 DIAGNOSIS — E785 Hyperlipidemia, unspecified: Secondary | ICD-10-CM

## 2013-09-14 MED ORDER — TYPHOID VACCINE PO CPDR: 1.0000 | DELAYED_RELEASE_CAPSULE | ORAL | Status: DC

## 2013-09-14 MED ORDER — CIPROFLOXACIN HCL 250 MG PO TABS: 250.0000 mg | ORAL_TABLET | Freq: Two times a day (BID) | ORAL | Status: DC

## 2013-09-14 MED ORDER — DOXYCYCLINE HYCLATE 100 MG PO TABS: 100.0000 mg | ORAL_TABLET | Freq: Every day | ORAL | Status: DC

## 2013-09-18 ENCOUNTER — Other Ambulatory Visit: Payer: Medicare Other

## 2013-09-23 ENCOUNTER — Encounter: Payer: Self-pay | Admitting: Pulmonary Disease

## 2013-09-23 ENCOUNTER — Ambulatory Visit (INDEPENDENT_AMBULATORY_CARE_PROVIDER_SITE_OTHER): Payer: Medicare Other | Admitting: Pulmonary Disease

## 2013-09-23 ENCOUNTER — Other Ambulatory Visit (INDEPENDENT_AMBULATORY_CARE_PROVIDER_SITE_OTHER): Payer: Medicare Other

## 2013-09-23 ENCOUNTER — Telehealth: Payer: Self-pay

## 2013-09-23 VITALS — BP 122/78 | HR 61 | Ht 65.5 in | Wt 170.2 lb

## 2013-09-23 DIAGNOSIS — E119 Type 2 diabetes mellitus without complications: Secondary | ICD-10-CM

## 2013-09-23 DIAGNOSIS — E785 Hyperlipidemia, unspecified: Secondary | ICD-10-CM

## 2013-09-23 DIAGNOSIS — G4730 Sleep apnea, unspecified: Secondary | ICD-10-CM

## 2013-09-23 DIAGNOSIS — I10 Essential (primary) hypertension: Secondary | ICD-10-CM

## 2013-09-23 LAB — COMPREHENSIVE METABOLIC PANEL
ALK PHOS: 51 U/L (ref 39–117)
ALT: 20 U/L (ref 0–35)
AST: 17 U/L (ref 0–37)
Albumin: 4.2 g/dL (ref 3.5–5.2)
BUN: 15 mg/dL (ref 6–23)
CO2: 28 mEq/L (ref 19–32)
Calcium: 9.2 mg/dL (ref 8.4–10.5)
Chloride: 103 mEq/L (ref 96–112)
Creatinine, Ser: 0.7 mg/dL (ref 0.4–1.2)
GFR: 87.68 mL/min (ref >60.00)
Glucose, Bld: 145 mg/dL — ABNORMAL HIGH (ref 70–99)
POTASSIUM: 4.2 meq/L (ref 3.5–5.1)
SODIUM: 138 meq/L (ref 135–145)
TOTAL PROTEIN: 6.8 g/dL (ref 6.0–8.3)
Total Bilirubin: 1 mg/dL (ref 0.2–1.2)

## 2013-09-23 LAB — LIPID PANEL
CHOL/HDL RATIO: 3
Cholesterol: 217 mg/dL — ABNORMAL HIGH (ref 0–200)
HDL: 71.4 mg/dL (ref >39.00)
LDL CALC: 126 mg/dL — AB (ref 0–99)
NonHDL: 145.6
Triglycerides: 99 mg/dL (ref 0.0–149.0)
VLDL: 19.8 mg/dL (ref 0.0–40.0)

## 2013-09-23 MED ORDER — ALPRAZOLAM 0.25 MG PO TABS: 0.2500 mg | ORAL_TABLET | Freq: Two times a day (BID) | ORAL | Status: DC | PRN

## 2013-09-23 NOTE — Telephone Encounter (Signed)
rx called into pharmacy

## 2013-09-23 NOTE — Progress Notes (Signed)
Subjective:    Patient ID: Catherine Hardin, female    DOB: [birth year], 63 y.o.   MRN: [id number]  HPI  63 year old, retired Engineer, civil (consulting) from [employer], presents for evaluation of sleep disordered breathing. Her husband passed away 2 years ago. Her boyfriend has noted loud snoring and restless sleep. She reports gasping episodes that wake her up from sleep. Epworth sleepiness score is 7. Bedtime is around 10 PM, sleep latency is always been one to 2 hours, since sleeps on her stomach without any pillows, reports 7-8 nocturnal awakenings including gasping episodes and nocturia and is out of bed by 8 AM with occasional dryness of mouth, but does feel rested without headaches. She is started on an exercise program and intentional he lost about 30 pounds in the last 2 years. There is no history suggestive of cataplexy, sleep paralysis or parasomnias She quit smoking 25 years ago and reports an occasional drink socially by 7 PM.  Past Medical History  Diagnosis Date  . Hyperlipidemia   . Hypertension   . GERD (gastroesophageal reflux disease)   . Diabetes mellitus   . Osteoarthritis, shoulder   . Depression   . TIA (transient ischemic attack) ~2008    sensory symptoms in head. MRI/carotids negative    Past Surgical History  Procedure Laterality Date  . Breast surgery  2006    Breast reduction  . Abdominal hysterectomy    . Tonsillectomy and adenoidectomy    . Ovarian cyst removal  ~1980s  . Rotator cuff repair  12/23/11    Right ---Dr Daphine Deutscher at [facility]    Allergies  Allergen Reactions  . Penicillins     History   Social History  . Marital Status: Widowed    Spouse Name: N/A    Number of Children: 3  . Years of Education: N/A   Occupational History  . RN     Retired   Social History Main Topics  . Smoking status: Former Smoker -- 0.50 packs/day for 30 years    Types: Cigarettes    Quit date: 03/01/1991  . Smokeless tobacco: Never Used  . Alcohol Use: Yes     Comment:  OCCAS 2-4 OZ QOD  . Drug Use: No  . Sexual Activity: Not on file   Other Topics Concern  . Not on file   Social History Narrative   2 stepchildren and one of her own      Has living will   Her son Catherine Hardin is health care POA   Would not accept resuscitation but no prolonged ventilation   No tube feeds if cognitively unaware.             Family History  Problem Relation Age of Onset  . Hypertension Mother   . Stroke Mother   . COPD Father   . Hypertension Father   . Heart disease Father   . ADD / ADHD Grandchild   . Colon cancer Father       Review of Systems  Constitutional: Negative for fever and unexpected weight change.  HENT: Negative for congestion, dental problem, ear pain, nosebleeds, postnasal drip, rhinorrhea, sinus pressure, sneezing, sore throat and trouble swallowing.   Eyes: Negative for redness and itching.  Respiratory: Negative for cough, chest tightness, shortness of breath and wheezing.   Cardiovascular: Negative for palpitations and leg swelling.  Gastrointestinal: Negative for nausea and vomiting.  Genitourinary: Negative for dysuria.  Musculoskeletal: Negative for joint swelling.  Skin: Negative for rash.  Neurological: Negative for  headaches.  Hematological: Does not bruise/bleed easily.  Psychiatric/Behavioral: Positive for dysphoric mood. The patient is nervous/anxious.        Objective:   Physical Exam  Gen. Pleasant, well-nourished, in no distress, normal affect ENT - no lesions, no post nasal drip Neck: No JVD, no thyromegaly, no carotid bruits Lungs: no use of accessory muscles, no dullness to percussion, clear without rales or rhonchi  Cardiovascular: Rhythm regular, heart sounds  normal, no murmurs or gallops, no peripheral edema Abdomen: soft and non-tender, no hepatosplenomegaly, BS normal. Musculoskeletal: No deformities, no cyanosis or clubbing Neuro:  alert, non focal       Assessment & Plan:

## 2013-09-23 NOTE — Patient Instructions (Signed)
Trial of stopping bedtime dose of Prozac Trial of melatonin 5 mg 2h prior to bedtime Home sleep study

## 2013-09-23 NOTE — Assessment & Plan Note (Signed)
Given excessive daytime somnolence, narrow pharyngeal exam, witnessed apneas & loud snoring, obstructive sleep apnea is very likely & an overnight polysomnogram will be scheduled as a home study. The pathophysiology of obstructive sleep apnea , it's cardiovascular consequences & modes of treatment including CPAP were discused with the patient in detail & they evidenced understanding.  

## 2013-09-23 NOTE — Telephone Encounter (Signed)
Pt left note requesting [place] [facility] [phone] for typhoid capsules,cipro and doxycyline. Noted from med list [facility] to [facility] on 09/14/13. Sp[facility] with [facility]Lynn at [facility] and she does have those rxs and will see about getting filled. Left detailed message for pt about rxs already at [facility] and will request refill for xanax. Pt also request [facility]ill [facility]ax to [facility].Please advise.

## 2013-09-23 NOTE — Telephone Encounter (Signed)
Okay alprazolam 0.25 bid prn #60 x 0

## 2013-12-11 DIAGNOSIS — G4730 Sleep apnea, unspecified: Secondary | ICD-10-CM

## 2013-12-13 ENCOUNTER — Telehealth: Payer: Self-pay | Admitting: Pulmonary Disease

## 2013-12-13 DIAGNOSIS — G4733 Obstructive sleep apnea (adult) (pediatric): Secondary | ICD-10-CM

## 2013-12-13 NOTE — Telephone Encounter (Signed)
Home study showed severe OSA - 30/hr Proceed with cpap titration study if she is willing

## 2013-12-13 NOTE — Telephone Encounter (Signed)
I spoke with patient about results and she verbalized understanding and had no questions Order placed. Will forward to PCC's to make aware

## 2013-12-17 DIAGNOSIS — G4730 Sleep apnea, unspecified: Secondary | ICD-10-CM

## 2013-12-18 ENCOUNTER — Encounter: Payer: Self-pay | Admitting: Pulmonary Disease

## 2014-01-14 ENCOUNTER — Other Ambulatory Visit: Payer: Self-pay | Admitting: *Deleted

## 2014-01-14 MED ORDER — HYDROCHLOROTHIAZIDE 25 MG PO TABS: 12.5000 mg | ORAL_TABLET | Freq: Every day | ORAL | Status: DC | PRN

## 2014-01-14 MED ORDER — ALPRAZOLAM 0.25 MG PO TABS: 0.2500 mg | ORAL_TABLET | Freq: Two times a day (BID) | ORAL | Status: DC | PRN

## 2014-01-14 NOTE — Telephone Encounter (Signed)
rx sent to pharmacy by e-script rx called into pharmacy  

## 2014-01-14 NOTE — Telephone Encounter (Signed)
[facility] stating that pt bought bottles in and asked them to call for refills? Please advise Xanax last filled 09/23/13

## 2014-01-14 NOTE — Telephone Encounter (Signed)
Approved: 1 year for HCTZ  #60 x 0 for alprazolam

## 2014-02-06 ENCOUNTER — Ambulatory Visit (INDEPENDENT_AMBULATORY_CARE_PROVIDER_SITE_OTHER): Payer: Medicare Other | Admitting: Internal Medicine

## 2014-02-06 ENCOUNTER — Encounter: Payer: Self-pay | Admitting: Internal Medicine

## 2014-02-06 VITALS — BP 134/68 | HR 84 | Temp 98.7°F | Wt 172.2 lb

## 2014-02-06 DIAGNOSIS — F39 Unspecified mood [affective] disorder: Secondary | ICD-10-CM

## 2014-02-06 DIAGNOSIS — E119 Type 2 diabetes mellitus without complications: Secondary | ICD-10-CM

## 2014-02-06 DIAGNOSIS — I10 Essential (primary) hypertension: Secondary | ICD-10-CM

## 2014-02-06 DIAGNOSIS — E785 Hyperlipidemia, unspecified: Secondary | ICD-10-CM

## 2014-02-06 LAB — CBC WITH DIFFERENTIAL/PLATELET
BASOS ABS: 0 10*3/uL (ref 0.0–0.1)
Basophils Relative: 0.5 % (ref 0.0–3.0)
Eosinophils Absolute: 0.1 10*3/uL (ref 0.0–0.7)
Eosinophils Relative: 1.6 % (ref 0.0–5.0)
HEMATOCRIT: 40.1 % (ref 36.0–46.0)
Hemoglobin: 13.1 g/dL (ref 12.0–15.0)
LYMPHS ABS: 2.4 10*3/uL (ref 0.7–4.0)
Lymphocytes Relative: 32.2 % (ref 12.0–46.0)
MCHC: 32.5 g/dL (ref 30.0–36.0)
MCV: 91.1 fl (ref 78.0–100.0)
MONOS PCT: 5 % (ref 3.0–12.0)
Monocytes Absolute: 0.4 10*3/uL (ref 0.1–1.0)
Neutro Abs: 4.6 10*3/uL (ref 1.4–7.7)
Neutrophils Relative %: 60.7 % (ref 43.0–77.0)
Platelets: 180 10*3/uL (ref 150.0–400.0)
RBC: 4.41 Mil/uL (ref 3.87–5.11)
RDW: 14 % (ref 11.5–15.5)
WBC: 7.5 10*3/uL (ref 4.0–10.5)

## 2014-02-06 LAB — HEPATIC FUNCTION PANEL
ALT: 18 U/L (ref 0–35)
AST: 18 U/L (ref 0–37)
Albumin: 4.5 g/dL (ref 3.5–5.2)
Alkaline Phosphatase: 59 U/L (ref 39–117)
BILIRUBIN DIRECT: 0.1 mg/dL (ref 0.0–0.3)
TOTAL PROTEIN: 7.2 g/dL (ref 6.0–8.3)
Total Bilirubin: 0.8 mg/dL (ref 0.2–1.2)

## 2014-02-06 LAB — LIPID PANEL
Cholesterol: 172 mg/dL (ref 0–200)
HDL: 72.4 mg/dL (ref >39.00)
LDL Cholesterol: 80 mg/dL (ref 0–99)
NonHDL: 99.6
Total CHOL/HDL Ratio: 2
Triglycerides: 96 mg/dL (ref 0.0–149.0)
VLDL: 19.2 mg/dL (ref 0.0–40.0)

## 2014-02-06 LAB — HEMOGLOBIN A1C: HEMOGLOBIN A1C: 6.5 % (ref 4.6–6.5)

## 2014-02-06 NOTE — Assessment & Plan Note (Signed)
Hopefully still controlled--hasn't been checking Due for plastic surgery (chin tuck)---doesn't appear to have any contraindications to this--not clear if she will need a preop visit

## 2014-02-06 NOTE — Assessment & Plan Note (Signed)
Back on the atorvastatin and no problems Will recheck

## 2014-02-06 NOTE — Assessment & Plan Note (Signed)
BP Readings from Last 3 Encounters:  02/06/14 134/68  09/23/13 122/78  08/07/13 120/78   Good control No changes needed

## 2014-02-06 NOTE — Progress Notes (Signed)
Pre visit review using our clinic review tool, if applicable. No additional management support is needed unless otherwise documented below in the visit note. 

## 2014-02-06 NOTE — Progress Notes (Signed)
Subjective:    Patient ID: Catherine Hardin, female    DOB: 10/25/1950, 63 y.o.   MRN: [id number]  HPI Here for follow up of diabetes and other chronic medical problems  Had a great time on trip to [place]  She did try to decrease the fluoxetine to 20mg  daily She noticed a change within a short period of time Found she "lost focus" but not overtly depressed Went back up to 40mg  daily Still grieves for husband Spends most of the time at the beach---selling condo here (can stay in room at son's house when around here)  Hasn't been checking sugars--plans to restart Having neck lift Feb 9 and know she has to have this under control No hypoglycemic reactions No trouble with her feet Over due for eye exam  Had home sleep study Does have sleep apnea Has appt for in house CPAP trial in January  Back on the atorvastatin No myalgias No GI problems  Not needing the alprazolam often Occasionally at night if mind is racing---rare in day  No chest pain No SOB Trying to walk regularly No dizziness or syncope  Current Outpatient Prescriptions on File Prior to Visit  Medication Sig Dispense Refill  . ALPRAZolam (XANAX) 0.25 MG tablet Take 1 tablet (0.25 mg total) by mouth 2 (two) times daily as needed. 60 tablet 0  . aspirin 81 MG tablet Take 81 mg by mouth daily.     Marland Kitchen. atorvastatin (LIPITOR) 40 MG tablet Take 1 tablet (40 mg total) by mouth daily. 90 tablet 3  . FLUoxetine (PROZAC) 20 MG capsule TAKE 2 CAPS BY MOUTH ONCE DAILY. (DEPRESSION) 60 capsule 11  . glucose blood test strip Use twice daily or as instructed 100 each 12  . hydrochlorothiazide (HYDRODIURIL) 25 MG tablet Take 0.5-1 tablets (12.5-25 mg total) by mouth daily as needed (swelling-edema). 30 tablet 11  . metFORMIN (GLUCOPHAGE) 1000 MG tablet TAKE ONE TABLET BY MOUTH TWICE A DAY. (DIABETES) 60 tablet 11  . oxybutynin (DITROPAN XL) 15 MG 24 hr tablet TAKE ONE TABLET BY MOUTH ONCE A DAY FOR BLADDER. as needed    .  pantoprazole (PROTONIX) 40 MG tablet TAKE ONE TABLET BY MOUTH TWICE A DAY. (G.E.R.D.) DO NOT CRUSH 60 tablet 11   No current facility-administered medications on file prior to visit.    Allergies  Allergen Reactions  . Penicillins     Past Medical History  Diagnosis Date  . Hyperlipidemia   . Hypertension   . GERD (gastroesophageal reflux disease)   . Diabetes mellitus   . Osteoarthritis, shoulder   . Depression   . TIA (transient ischemic attack) ~2008    sensory symptoms in head. MRI/carotids negative    Past Surgical History  Procedure Laterality Date  . Breast surgery  2006    Breast reduction  . Abdominal hysterectomy    . Tonsillectomy and adenoidectomy    . Ovarian cyst removal  ~1980s  . Rotator cuff repair  12/23/11    Right ---[facility] Daph[facility]scherMartin [facility]    Family History  Problem Relation Age of Onset  . Hypertension Mother   . Stroke Mother   . COPD Father   . Hypertension Father   . Heart disease Father   . ADD / ADHD Grandchild   . Colon cancer Father     History   Social History  . Marital Status: Widowed    Spouse Name: N/A    Number of Children: 3  . Years of Education:  N/A   Occupational History  . RN     Retired   Social History Main Topics  . Smoking status: Former Smoker -- 0.50 packs/day for 30 years    Types: Cigarettes    Quit date: 03/01/1991  . Smokeless tobacco: Never Used  . Alcohol Use: 0.0 oz/week    0 Not specified per week     Comment: OCCAS 2-4 OZ QOD  . Drug Use: No  . Sexual Activity: Not on file   Other Topics Concern  . Not on file   Social History Narrative   2 stepchildren and one of her own      Has living will   Her son Myrtis SerJoe Thornton is health care POA   Would not accept resuscitation but no prolonged ventilation   No tube feeds if cognitively unaware.            Review of Systems Sleeps fair---mind will race at times Appetite is fine Weight up 2#     Objective:   Physical Exam  Constitutional:  She appears well-developed and well-nourished. No distress.  Neck: Normal range of motion. Neck supple. No thyromegaly present.  Cardiovascular: Normal rate, regular rhythm, normal heart sounds and intact distal pulses.  Exam reveals no gallop.   No murmur heard. Pulmonary/Chest: Effort normal and breath sounds normal. No respiratory distress. She has no wheezes. She has no rales.  Abdominal: Soft. There is no tenderness.  Musculoskeletal: She exhibits no edema or tenderness.  Lymphadenopathy:    She has no cervical adenopathy.  Skin:  No foot lesions  Psychiatric: She has a normal mood and affect. Her behavior is normal.          Assessment & Plan:

## 2014-02-06 NOTE — Assessment & Plan Note (Signed)
Mood not right on lower dose--- poor focus, etc Still with some grieving Will continue th 40mg  dose

## 2014-03-05 ENCOUNTER — Encounter (HOSPITAL_BASED_OUTPATIENT_CLINIC_OR_DEPARTMENT_OTHER): Payer: Medicare Other

## 2014-03-13 ENCOUNTER — Ambulatory Visit (HOSPITAL_BASED_OUTPATIENT_CLINIC_OR_DEPARTMENT_OTHER): Payer: Medicare Other | Attending: Pulmonary Disease

## 2014-03-13 VITALS — Ht 67.0 in | Wt 168.0 lb

## 2014-03-13 DIAGNOSIS — G4733 Obstructive sleep apnea (adult) (pediatric): Secondary | ICD-10-CM | POA: Diagnosis not present

## 2014-03-15 IMAGING — US ABDOMEN ULTRASOUND LIMITED
1 series · 14 of 25 positions shown · non-contrast
Comparison: none

REASON FOR EXAM: RUQ  epigastric pain  gallbladder
COMMENTS:

[Series 1: abdomen ultrasound limited · 0.26mm/px · 14 of 33 slices shown]
[im 1/33]
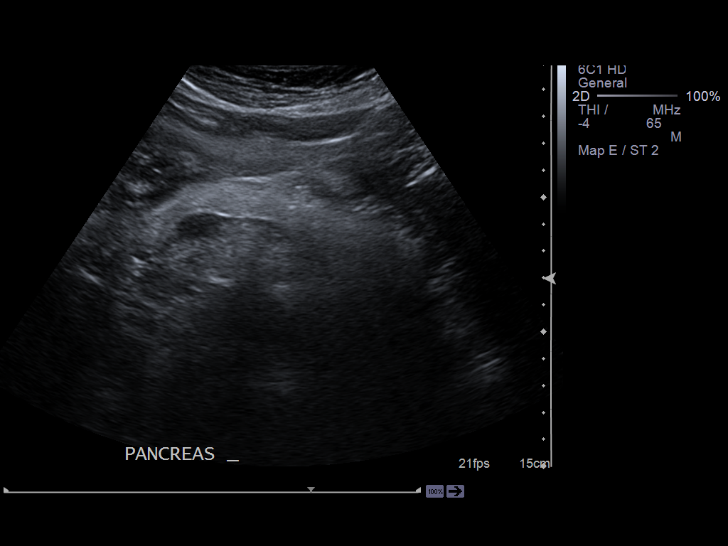
[im 3/33]
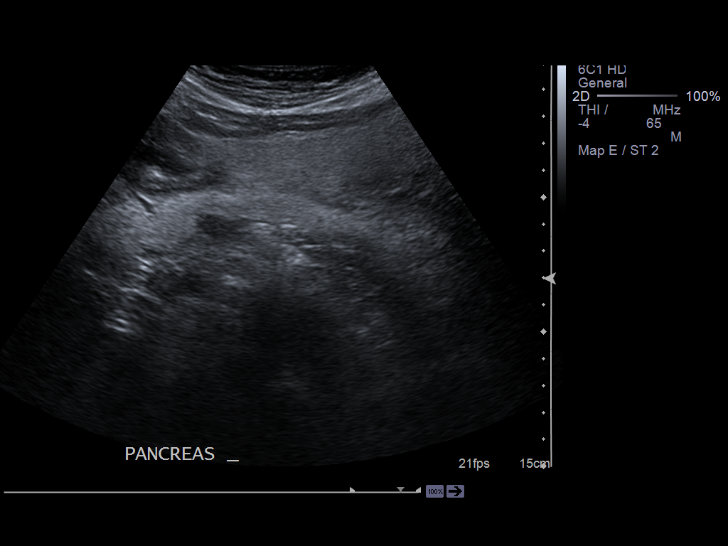
[im 6/33]
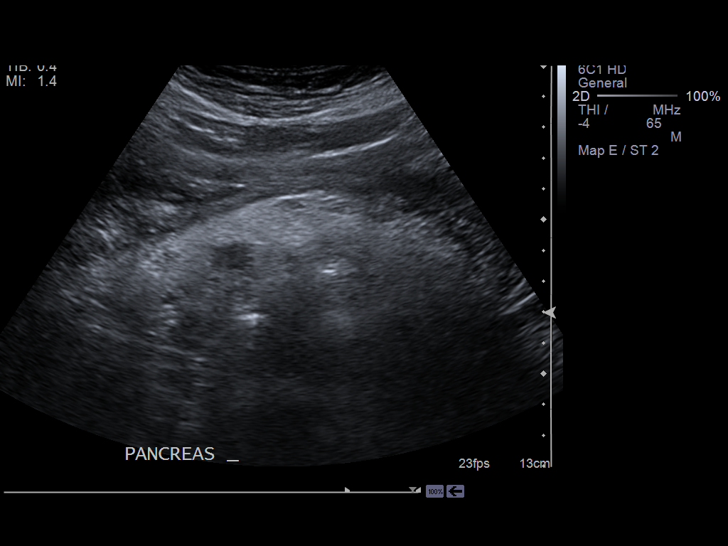
[im 9/33]
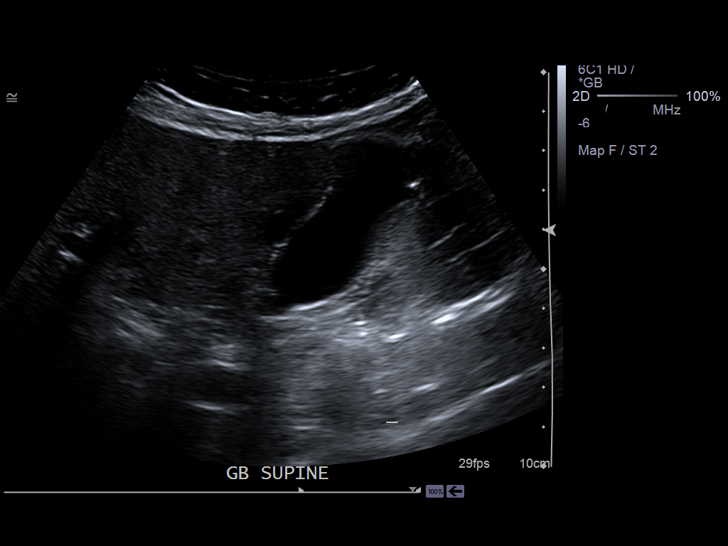
[im 11/33]
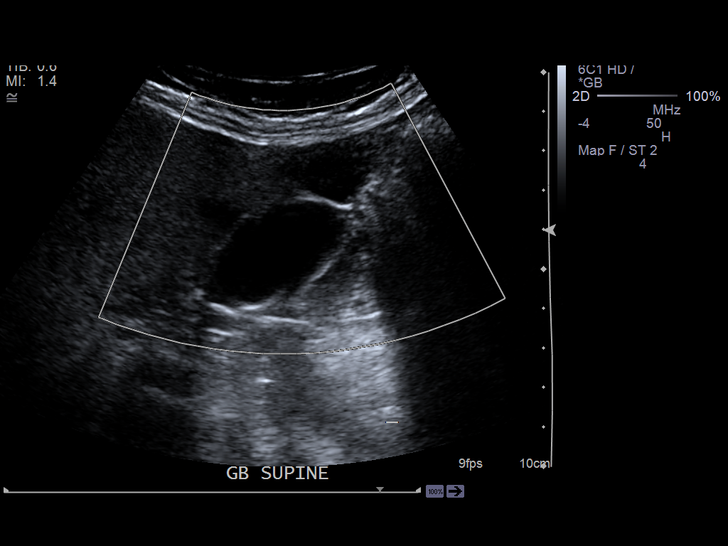
[im 13/33]
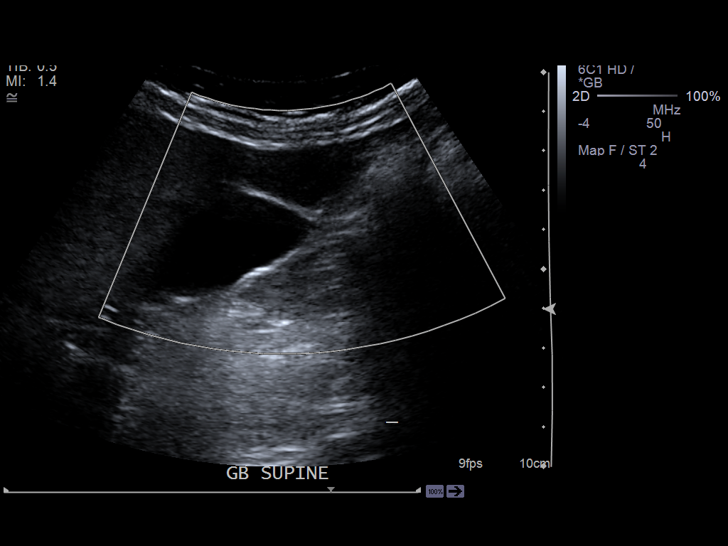
[im 15/33]
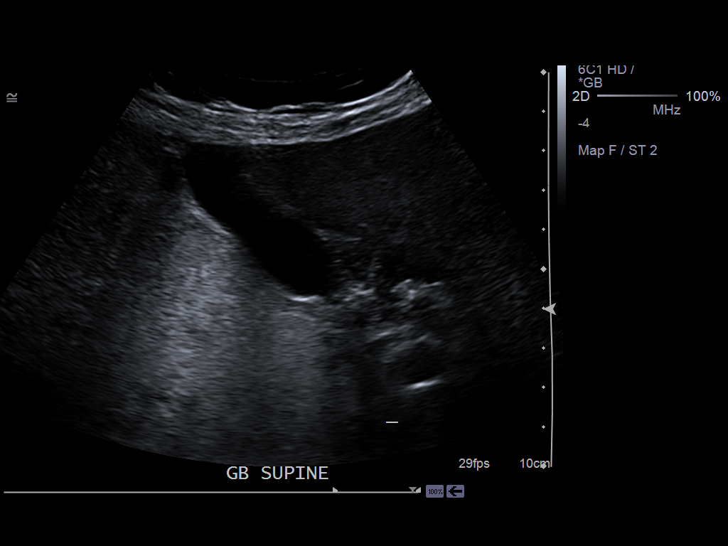
[im 18/33]
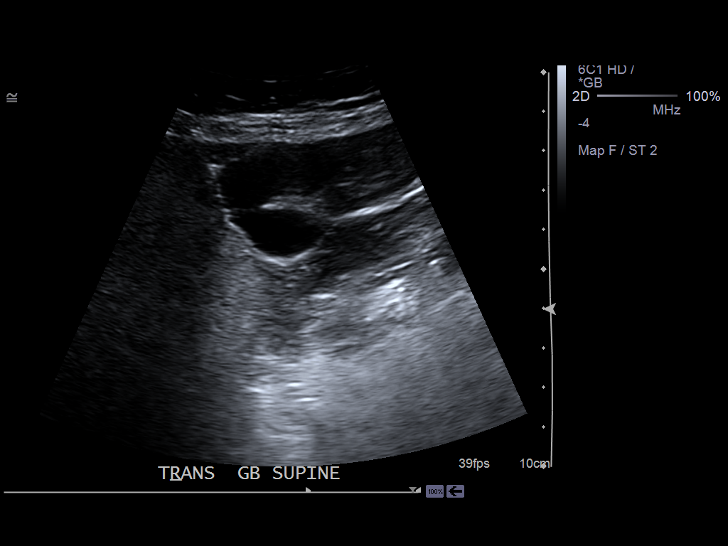
[im 21/33]
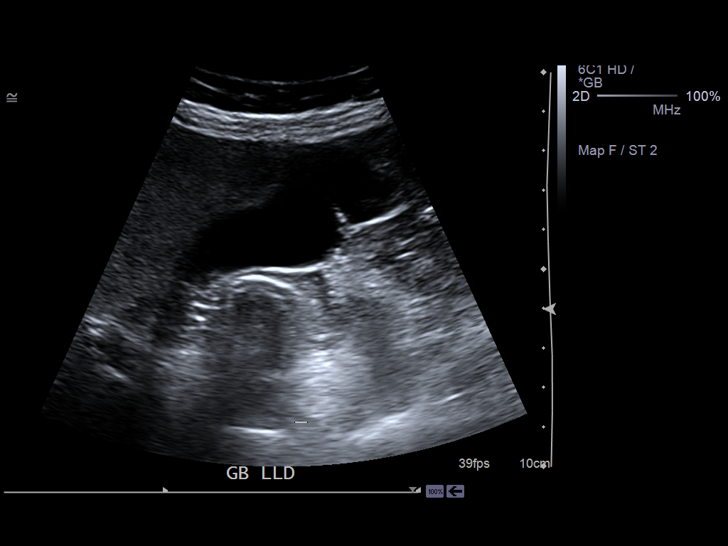
[im 22/33]
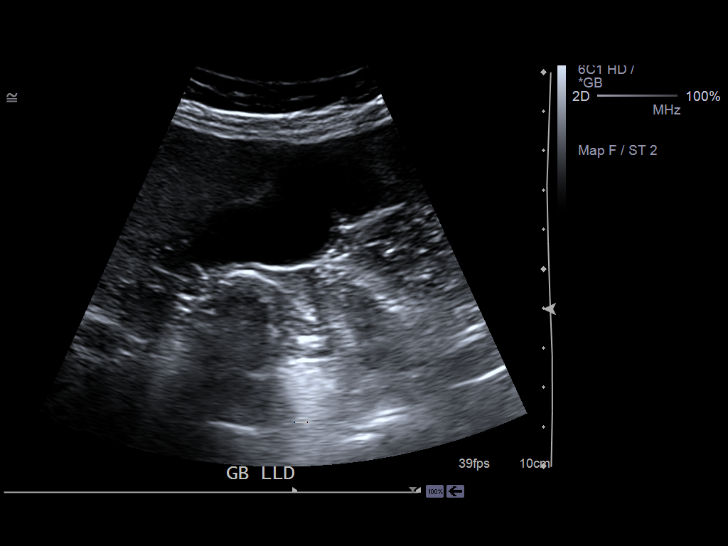
[im 25/33]
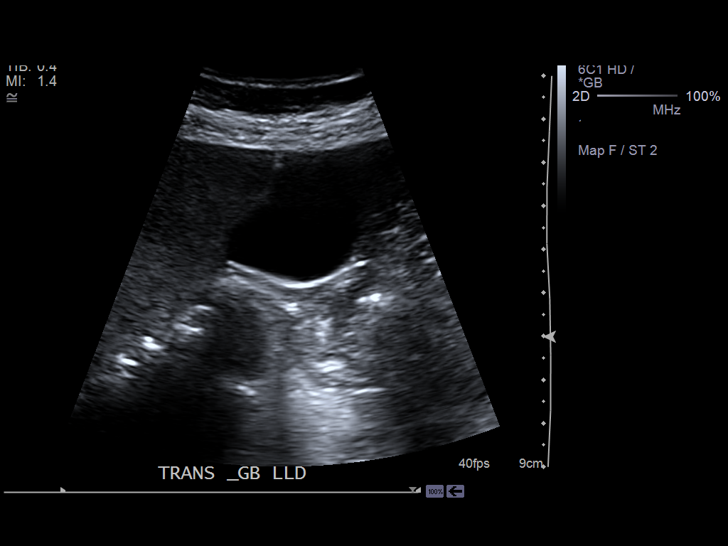
[im 27/33]
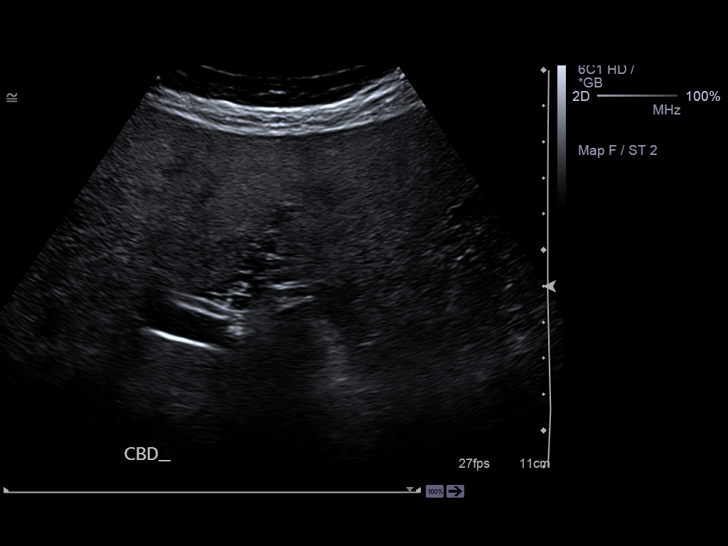
[im 30/33]
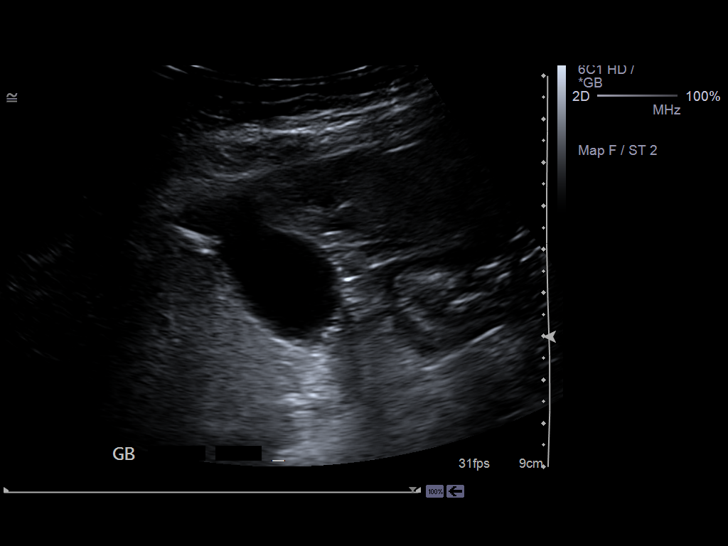
[im 33/33]
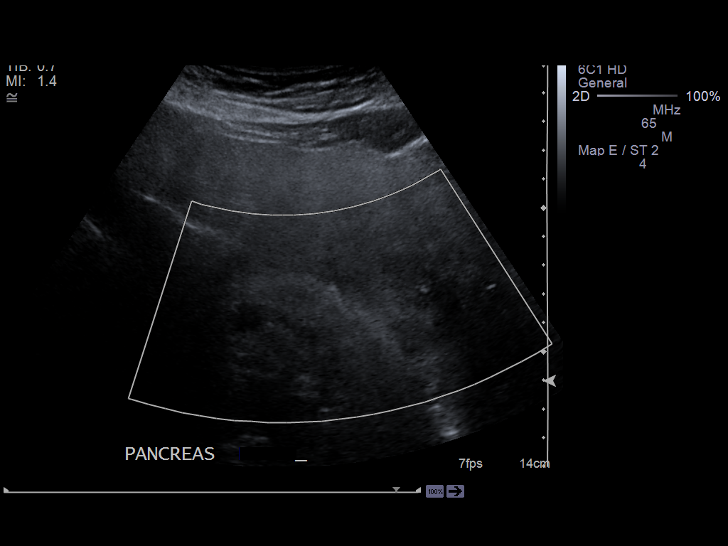

[14 of 25 positions shown; findings below may reference images not displayed]

PROCEDURE:     US  - US ABDOMEN LIMITED SURVEY  - January 04, 2012  [DATE]

RESULT:     Limited right upper quadrant abdominal sonogram is performed.
The pancreas included on the study appears to be normal. The gallbladder
shows no evidence of stones, mass or polyp. There is a normal appearance of
the gallbladder wall with a thickness of 1.8 mm measured. Portal venous flow
is normal. The common bile duct diameter is 2.5 mm. There is no abnormal
fluid collection or ascites.
IMPRESSION: Normal limited right upper quadrant abdominal sonogram.

[REDACTED]

## 2014-03-18 ENCOUNTER — Encounter (HOSPITAL_BASED_OUTPATIENT_CLINIC_OR_DEPARTMENT_OTHER): Payer: Medicare Other

## 2014-03-24 ENCOUNTER — Telehealth: Payer: Self-pay | Admitting: Pulmonary Disease

## 2014-03-24 DIAGNOSIS — G4733 Obstructive sleep apnea (adult) (pediatric): Secondary | ICD-10-CM

## 2014-03-24 DIAGNOSIS — G4730 Sleep apnea, unspecified: Secondary | ICD-10-CM

## 2014-03-24 NOTE — Telephone Encounter (Signed)
Rx placed for-C Pap 9 cm, fullface mask. -arrange follow-up office visit in 6 weeks

## 2014-03-24 NOTE — Sleep Study (Signed)
[facility]  NAME: Catherine StaggersSandra Hardin  DATE OF BIRTH: 02-10-1951  MEDICAL RECORD NUMBER [id number]  LOCATION: [facility]  PHYSICIAN: Zaniyah Wernette V.  DATE OF STUDY: 03/13/14   SLEEP STUDY TYPE: CPAP titration study               REFERRING PHYSICIAN: Oretha MilchAlva, Braylee Lal V, MD  INDICATION FOR STUDY: 64 year old with severe OSA, AHI of 30 per hour on home study. At the time of this study ,they weighed 168 pounds with a height of  5 ft 7 inches and the BMI of 26, neck size of 15 inches. Epworth sleepiness score was 15   This CPAP titration polysomnogram was performed with a sleep technologist in attendance. EEG, EOG,EMG and respiratory parameters recorded. Sleep stages, arousals, limb movements and respiratory data was scored according to criteria laid out by the American Academy of sleep medicine.  SLEEP ARCHITECTURE: Lights out was at 2313 PM and lights on was at 537 AM. Total sleep time was 271 minutes with sleep period time of 343 minutes and sleep efficiency of 71% .Sleep latency was 41 minutes with latency to REM sleep of 239 minutes and wake after sleep onset of 71 minutes.  Sleep stages as a percentage of total sleep time was N1 6.5 N2- 87 % and REM sleep 6.6 % ( 18 minutes) . The longest period of REM sleep was around 4 AM.   AROUSAL DATA : There were 21 arousals with an arousal index of 5 events per hour. Of these 20 were spontaneous, and 1 were associated with respiratory events and 0 were associated periodic limb movements  RESPIRATORY DATA: CPAP was initiated at 5 centimeters and titrated to a final level of 9 centimeters due to respiratory events and snoring. At the final level of 9 centimeters, there were 0 obstructive apneas, 1 central apneas, 0 mixed apneas and 1 hypopneas with apnea -hypopnea index of 2 events per hour.  There was no relation to sleep stage or body position. Titration was optimal.  MOVEMENT/PARASOMNIA: There were 0 PLMS with a PLM  index of 0 events per hour. The PLM arousal index was 0 events per hour.  OXYGEN DATA: The lowest desaturation was 91 % during non-REM sleep and the desaturation index was 6 per hour. The saturations stayed below 88% for 0 minutes.  CARDIAC DATA: The low heart rate was 38 beats per minute. The high heart rate recorded was an artifact. No arrhythmias were noted   IMPRESSION :  1. Moderate obstructive sleep apnea with hypopneas causing sleep fragmentation and mild oxygen desaturation. 2. This was corrected by CPAP of 9 centimeters with a full face mask. Titration was optimal. 3. No evidence of cardiac arrhythmias or behavioral disturbance during sleep. 4. Periodic limb movements were not noted.  RECOMMENDATION:    1. The treatment options for this degree of sleep disordered breathing includes weight loss and/or CPAP therapy. CPAP can be initiated at 9 centimeters with a full face mask and compliance monitored at this level. 2. Patient should be cautioned against driving when sleepy 3. They should be asked to avoid medications with sedative side effects  Oretha MilchALVA,Tova Vater V.  MD Diplomate, American Board of Sleep Medicine  ELECTRONICALLY SIGNED ON: 03/24/2014   [facility] PH: [phone]   FX: [phone] ACCREDITED BY THE AMERICAN ACADEMY OF SLEEP MEDICINE

## 2014-03-24 NOTE — Telephone Encounter (Signed)
Patient notified.  Patient will call to schedule 6 week follow up when she receives and starts using the CPAP.  Patient wanted to know if she could get a travel CPAP as well.  She is currently not at her home, she is staying with her son and wants to know if she can pick up CPAP in [place].  Message sent to Threasa Beardshonda, Dawn to check into.  Nothing further needed.

## 2014-04-01 NOTE — Telephone Encounter (Signed)
Staff message from Dr. Reginia NaasAlva's Nurse, Marcelino DusterMichelle, printed & given to [facility] liason Henderson NewcomerMelissa Stenson on 03/24/14.  [facility] will contact pt to answer her questions Lucilla Edinawne J Law

## 2014-04-25 ENCOUNTER — Other Ambulatory Visit: Payer: Self-pay

## 2014-04-25 NOTE — Telephone Encounter (Signed)
Refill request from [facility] for xanax 0.25mg .  Last seen 02/06/2014.  Last filled 01/14/2014.  Please advise.

## 2014-04-26 NOTE — Telephone Encounter (Signed)
Approved: #60 x 0 

## 2014-04-28 MED ORDER — ALPRAZOLAM 0.25 MG PO TABS: 0.2500 mg | ORAL_TABLET | Freq: Two times a day (BID) | ORAL | Status: DC | PRN

## 2014-04-28 NOTE — Telephone Encounter (Signed)
Approved: already approved #60 x 0 

## 2014-04-28 NOTE — Telephone Encounter (Signed)
Electronic request for xanax.  Patient last seen 02/06/2014.  Last filled 01/14/2014.  Please advise.

## 2014-04-28 NOTE — Telephone Encounter (Signed)
Xanax refill called into pharmacy per Dr Alphonsus SiasLetvak approval.

## 2014-07-10 ENCOUNTER — Other Ambulatory Visit: Payer: Self-pay | Admitting: *Deleted

## 2014-07-10 MED ORDER — METFORMIN HCL 1000 MG PO TABS: 1000.0000 mg | ORAL_TABLET | Freq: Two times a day (BID) | ORAL | Status: AC

## 2014-07-10 MED ORDER — PANTOPRAZOLE SODIUM 40 MG PO TBEC: 40.0000 mg | DELAYED_RELEASE_TABLET | Freq: Two times a day (BID) | ORAL | Status: AC

## 2014-08-26 ENCOUNTER — Other Ambulatory Visit: Payer: Self-pay | Admitting: *Deleted

## 2014-08-26 MED ORDER — FLUOXETINE HCL 20 MG PO CAPS
40.0000 mg | ORAL_CAPSULE | Freq: Every day | ORAL | Status: DC
Start: 2014-08-26 — End: 2015-11-30

## 2014-08-26 MED ORDER — ATORVASTATIN CALCIUM 40 MG PO TABS: 40.0000 mg | ORAL_TABLET | Freq: Every day | ORAL | Status: AC

## 2015-03-30 ENCOUNTER — Other Ambulatory Visit: Payer: Self-pay | Admitting: *Deleted

## 2015-03-30 NOTE — Telephone Encounter (Signed)
Last filled 03/2014, pt last seen 02/06/2014, no future appointments scheduled

## 2015-03-31 MED ORDER — ALPRAZOLAM 0.25 MG PO TABS: 0.2500 mg | ORAL_TABLET | Freq: Two times a day (BID) | ORAL | Status: AC | PRN

## 2015-03-31 NOTE — Telephone Encounter (Signed)
Approved: #60 x 0 Have her set up a wellness visit in the next few months

## 2015-03-31 NOTE — Telephone Encounter (Signed)
rx called into pharmacy Left message on machine for patient to call for appt

## 2015-08-06 ENCOUNTER — Telehealth: Payer: Self-pay | Admitting: Internal Medicine

## 2015-08-06 NOTE — Telephone Encounter (Signed)
Left message asking pt to call office per thn list °Please make follow up or cpx with pcp °Or see if pt is see another pcp °

## 2015-08-06 NOTE — Telephone Encounter (Signed)
Patient returned Robin's call.  Patient is seeing another PCP.

## 2015-11-30 ENCOUNTER — Ambulatory Visit (INDEPENDENT_AMBULATORY_CARE_PROVIDER_SITE_OTHER): Payer: Medicare Other | Admitting: Pulmonary Disease

## 2015-11-30 ENCOUNTER — Encounter: Payer: Self-pay | Admitting: Pulmonary Disease

## 2015-11-30 DIAGNOSIS — I10 Essential (primary) hypertension: Secondary | ICD-10-CM

## 2015-11-30 DIAGNOSIS — G4733 Obstructive sleep apnea (adult) (pediatric): Secondary | ICD-10-CM

## 2015-11-30 NOTE — Addendum Note (Signed)
Addended by: York RamGAY, Imari Reen on: 11/30/2015 04:28 PM   Modules accepted: Orders

## 2015-11-30 NOTE — Assessment & Plan Note (Signed)
Well controlled 

## 2015-11-30 NOTE — Progress Notes (Signed)
   Subjective:    Patient ID: Catherine StaggersSandra Hardin, female    DOB: [birth year], 65 y.o.   MRN: [id number]  HPI  65 year old, retired Engineer, civil (consulting)nurse from [employer], for FU of OSA She quit smoking 25 years ago and reports an occasional drink socially by 7 PM.  11/30/2015  Chief Complaint  Patient presents with  . Follow-up    doing well on CPAP, but thinks pressure needs to be adjusted. would like new F20 memory foam mask.   This is her first follow-up visit after being set up with CPAP in 2016 She had good results with CPAP of 9 cm-has more energy and denies daytime sleepiness. No bed partner history is available since she lives alone with her dog. [place]. Download shows excellent compliance with good control of events of 9 cm and mild leak with a full face mask  She has never tried nasal interface since she is a mouth breather. She would like to try memory foam fullface mask and needs a prescription for the same  She multiple surgeries this year and has gained about 20 pounds   Significant tests/ events  HST 11/2013 severe , AHI 30/h 02/2014 CPAP 9 cm  Review of Systems neg for any significant sore throat, dysphagia, itching, sneezing, nasal congestion or excess/ purulent secretions, fever, chills, sweats, unintended wt loss, pleuritic or exertional cp, hempoptysis, orthopnea pnd or change in chronic leg swelling.   Also denies presyncope, palpitations, heartburn, abdominal pain, nausea, vomiting, diarrhea or change in bowel or urinary habits, dysuria,hematuria, rash, arthralgias, visual complaints, headache, numbness weakness or ataxia.     Objective:   Physical Exam  Gen. Pleasant, obese, in no distress ENT - no lesions, no post nasal drip Neck: No JVD, no thyromegaly, no carotid bruits Lungs: no use of accessory muscles, no dullness to percussion, decreased without rales or rhonchi  Cardiovascular: Rhythm regular, heart sounds  normal, no murmurs or gallops, no  peripheral edema Musculoskeletal: No deformities, no cyanosis or clubbing , no tremors        Assessment & Plan:

## 2015-11-30 NOTE — Patient Instructions (Signed)
Your CPAP is set at 9 cm and is working well Prescription for nasal pillows Prescription for memory foam fullface mask will be sent to DME

## 2015-11-30 NOTE — Assessment & Plan Note (Signed)
CPAP is set at 9 cm and is working well Prescription for nasal pillows Prescription for memory foam fullface mask will be sent to DME  Weight loss encouraged, compliance with goal of at least 4-6 hrs every night is the expectation. Advised against medications with sedative side effects Cautioned against driving when sleepy - understanding that sleepiness will vary on a day to day basis

## 2015-12-03 ENCOUNTER — Telehealth: Payer: Self-pay | Admitting: Pulmonary Disease

## 2015-12-03 DIAGNOSIS — G4733 Obstructive sleep apnea (adult) (pediatric): Secondary | ICD-10-CM

## 2015-12-03 NOTE — Telephone Encounter (Signed)
Spoke with pt. She is needing to have her DME changed. [place] is currently living in [place], [place]. Order has been placed to have DME closer to where she lives. Nothing further was needed.

## 2015-12-09 ENCOUNTER — Encounter: Payer: Self-pay | Admitting: Pulmonary Disease
